# Patient Record
Sex: Female | Born: 1955
Health system: Southern US, Community
[De-identification: ages and names within clinical notes are randomized; demographics above are authoritative.]

## PROBLEM LIST (undated history)

## (undated) DIAGNOSIS — I10 Essential (primary) hypertension: Secondary | ICD-10-CM

## (undated) DIAGNOSIS — K219 Gastro-esophageal reflux disease without esophagitis: Secondary | ICD-10-CM

## (undated) DIAGNOSIS — S82851A Displaced trimalleolar fracture of right lower leg, initial encounter for closed fracture: Secondary | ICD-10-CM

## (undated) DIAGNOSIS — I480 Paroxysmal atrial fibrillation: Secondary | ICD-10-CM

## (undated) DIAGNOSIS — E785 Hyperlipidemia, unspecified: Secondary | ICD-10-CM

## (undated) DIAGNOSIS — F32A Depression, unspecified: Secondary | ICD-10-CM

## (undated) DIAGNOSIS — E669 Obesity, unspecified: Secondary | ICD-10-CM

## (undated) DIAGNOSIS — F419 Anxiety disorder, unspecified: Secondary | ICD-10-CM

## (undated) HISTORY — DX: Hyperlipidemia, unspecified: E78.5

## (undated) HISTORY — DX: Essential (primary) hypertension: I10

## (undated) HISTORY — PX: EYE SURGERY: SHX253

## (undated) HISTORY — DX: Obesity, unspecified: E66.9

## (undated) HISTORY — PX: ABDOMINAL HYSTERECTOMY: SHX81

## (undated) HISTORY — DX: Paroxysmal atrial fibrillation: I48.0

---

## 1995-07-04 HISTORY — PX: KIDNEY STONE SURGERY: SHX686

## 1998-03-02 ENCOUNTER — Emergency Department (HOSPITAL_COMMUNITY): Admission: EM | Admit: 1998-03-02 | Discharge: 1998-03-03 | Payer: Self-pay | Admitting: Emergency Medicine

## 1998-12-07 ENCOUNTER — Other Ambulatory Visit: Admission: RE | Admit: 1998-12-07 | Discharge: 1998-12-07 | Payer: Self-pay | Admitting: Obstetrics & Gynecology

## 2000-01-05 ENCOUNTER — Other Ambulatory Visit: Admission: RE | Admit: 2000-01-05 | Discharge: 2000-01-05 | Payer: Self-pay | Admitting: Obstetrics & Gynecology

## 2001-02-04 ENCOUNTER — Other Ambulatory Visit: Admission: RE | Admit: 2001-02-04 | Discharge: 2001-02-04 | Payer: Self-pay | Admitting: Obstetrics & Gynecology

## 2002-03-12 ENCOUNTER — Other Ambulatory Visit: Admission: RE | Admit: 2002-03-12 | Discharge: 2002-03-12 | Payer: Self-pay | Admitting: Obstetrics & Gynecology

## 2003-04-28 ENCOUNTER — Other Ambulatory Visit: Admission: RE | Admit: 2003-04-28 | Discharge: 2003-04-28 | Payer: Self-pay | Admitting: Obstetrics & Gynecology

## 2003-06-24 ENCOUNTER — Encounter (INDEPENDENT_AMBULATORY_CARE_PROVIDER_SITE_OTHER): Payer: Self-pay

## 2003-06-24 ENCOUNTER — Ambulatory Visit (HOSPITAL_COMMUNITY): Admission: RE | Admit: 2003-06-24 | Discharge: 2003-06-24 | Payer: Self-pay | Admitting: Obstetrics & Gynecology

## 2003-07-07 ENCOUNTER — Inpatient Hospital Stay (HOSPITAL_COMMUNITY): Admission: AD | Admit: 2003-07-07 | Discharge: 2003-07-07 | Payer: Self-pay | Admitting: Obstetrics and Gynecology

## 2003-08-03 ENCOUNTER — Inpatient Hospital Stay (HOSPITAL_COMMUNITY): Admission: RE | Admit: 2003-08-03 | Discharge: 2003-08-05 | Payer: Self-pay | Admitting: Obstetrics & Gynecology

## 2003-08-03 ENCOUNTER — Encounter (INDEPENDENT_AMBULATORY_CARE_PROVIDER_SITE_OTHER): Payer: Self-pay | Admitting: Specialist

## 2006-11-29 ENCOUNTER — Encounter: Admission: RE | Admit: 2006-11-29 | Discharge: 2006-11-29 | Payer: Self-pay | Admitting: Family Medicine

## 2010-07-25 ENCOUNTER — Encounter: Payer: Self-pay | Admitting: Obstetrics & Gynecology

## 2010-11-18 NOTE — H&P (Signed)
NAME:  Brandi Waters, Brandi Waters                         ACCOUNT NO.:  [id number]   MEDICAL RECORD NO.:  [id number]                   PATIENT TYPE:  INP   LOCATION:  NA                                   FACILITY:  [facility]   PHYSICIAN:  Gerrit Friends. Aldona Bar, M.D.                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  Brandi Waters has been a patient of mine for a long period of time  and in October of 2004, she came in for an annual examination and had a Pap  smear that was consistent with CIN 2.  She subsequently underwent colposcopy  and biopsy which revealed wide spread moderate dysplasia. Because of the  wide spread nature of the dysplasia, a decision was made to proceed with a  conization of the cervix which was carried out on December 22.  The  conization pathology report revealed severe dysplasia--carcinoma in situ,  margins were free and no invasive disease was noted.  The carcinoma in situ  was rather wide spread however.  The patient did have some problems with  some bleeding postoperatively but basically has done well.  Because of the  pathologic report and the patient's desire for further surgery along with my  concern of the wide spread nature of the carcinoma in situ on the  conization, a decision was made to proceed with a total abdominal  hysterectomy and bilateral salpingo-oophorectomy.  The patient's admitted  for such procedure at this time.   Her recent mammogram in October was within normal limits.   PAST MEDICAL HISTORY/MEDICATIONS:  1. Lopressor 50 mg daily.  2. __________ 50 mg twice daily.  3. Zoloft 1/2 of a 50 mg tablet on an as needed basis.  4.  Xanax 0.25 mg on an as needed basis.   The patient has no known allergies, she is a nonsmoker.   PAST SURGICAL HISTORY:  None with the exception of above.   FAMILY AND SOCIAL HISTORY:  Noncontributory.   PHYSICAL EXAMINATION ON ADMISSION:  GENERAL:  Well-developed, white female.  VITAL SIGNS:  Weight 195, blood pressure 110/70, pulse 80 and regular,  respirations 18 and regular, temperature 98.2.  HEENT:  Negative. Thyroid not enlarged.  CHEST:  Clear to auscultation and percussion.  CARDIOVASCULAR:  Regular rhythm with no murmur.  BREASTS:  Negative.  ABDOMEN:  Unremarkable.  PELVIC:  External genitalia, BUS normal.  Introitus by digital vault well  supported. Cervix is very difficult to visualize secondary to decreased  mobility of the uterus.  The uterus however feels to be upper limits of  normal size  but is not mobile-does not pull down at all.  Adnexa is  unremarkable.  Rectovaginal exam confirmatory.  EXTREMITIES:  Negative.  NEUROLOGIC:  Physiologic.   IMPRESSION:  Carcinoma in situ of the uterine cervix status post conization  December 2004.   PLAN:  Total abdominal hysterectomy and bilateral salpingo-oophorectomy. The  risks and benefits of surgical procedure have been totally discussed in  detail with the patient and she understands and is ready to proceed.                                               Gerrit Friends. Aldona Bar, M.D.    RMW/MEDQ  D:  07/31/2003  T:  07/31/2003  Job:  [id number]

## 2010-11-18 NOTE — Op Note (Signed)
NAMESHUNTAVIA, YERBY                         ACCOUNT NO.:  [id number]   MEDICAL RECORD NO.:  [id number]                   PATIENT TYPE:  INP   LOCATION:  9399                                 [facility]ACILITY:  [facility]   PHYSICIAN:  Gerrit Friends. Aldona Bar, M.D.                DATE OF BIRTH:  [birth year]   DATE OF PROCEDURE:  08/03/2003  DATE OF DISCHARGE:                                 OPERATIVE REPORT   PREOPERATIVE DIAGNOSIS:  Carcinoma in situ of the uterine cervix - status  post conization June 24, 2003.   POSTOPERATIVE DIAGNOSIS:  Carcinoma in situ if the uterine cervix - status  post conization June 24, 2003.  Pathology pending.   PROCEDURE:  Total abdominal hysterectomy with bilateral salpingo-  oophorectomy.   SURGEON:  Gerrit Friends. Aldona Bar, M.D.   ASSISTANTLuvenia Redden, M.D.   DESCRIPTION OF PROCEDURE:  The patient was taken to the operating room  where, after the satisfactory induction of general endotracheal anesthesia,  she was prepped and draped, having been placed in the supine position.  A  Foley catheter was inserted as part of the prep.   After the patient was adequately draped, the procedure was begun.  A  Pfannenstiel incision was made and with minimal difficulty, dissected down  sharply to and through the fascia in a low transverse fashion with  hemostasis created at each layer.  Once the fascia was opened, subfascial  space was created inferiorly and superiorly.  Muscles separated in the  midline.  Peritoneum identified and entered appropriately with care taken to  avoid the bowels superiorly and the bladder inferiorly.  At this time,  inspection of the abdomen was carried out.  The uterus was small, posterior,  was not very mobile.  Both ovaries appeared normal.  There was no evidence  of any endometriosis or adhesions.  The O'Connor-O'Sullivan retractor was  placed and bowel was packed off without difficulty.  At this time,  hysterectomy was begun in the usual  fashion.  Two long Kelly clamps were  placed in the cornua of the uterus and the uterus was elevated.  The round  ligaments were isolated, suture secured, opened with the Bovie as was the  anterior flap.  Bladder was pushed off the lower uterine segment.  At this  time, the infundibulopelvic pedicles were isolated and after identification  of the ureters, clamped and doubly suture secured.  The uterine artery  pedicles were then clamped using curved Heaney clamps and suture secured  with 0 Vicryl suture and parametrial bite on each side was taken  accordingly.  The previous conization specimen was a large cone and there  was not much cervix left.  After parametrial bites bilaterally, there was  only the uterosacral ligament left to be dealt with and after this was  clamped and cut, the specimen essentially was removed.  The vagina was  identified.  The  vagina was closed with 0 Vicryl in a figure-of-eight  fashion.  Hemostasis was noted to be very adequate.  Again, the ureters were  identified bilaterally and urine output at this time was noted to be clear  and adequate volume.  The procedure at this time, was felt to be complete.  Packs were removed.  Retractor was removed.  Appendix was identified and was  very retrocecal but normal.  It was left in situ.  At this time, pelvis  again was inspected and noted to be dry.  After all counts were noted to be  correct and after no foreign bodies were noted to be remaining in the  abdominal cavity, closure of the abdomen was carried out in layers.  The  abdominal peritoneum was closed with 0 Vicryl in a running fashion.  Muscles  were then secured with interrupted 0 Vicryl.  Assured of good subfascial  hemostasis.  The fascia was then reapproximated using 0 Vicryl from angle to  midline bilaterally.  Subcutaneous tissue was rendered  hemostatic and  reapproximated with 2-0 plain in interrupted subcuticular fashion.  Staples  were then used to  close the skin.  Sterile pressure dressing was applied.  At this time, the patient was transported to the recovery room in  satisfactory condition having tolerated the procedure well.   ESTIMATED BLOOD LOSS:  200 mL.   All counts correct x2.   SUMMARY:  This patient underwent a conization of the cervix on June 24, 2003, for a wide spread carcinoma in situ of the cervix.  The patient, after  appropriate discussion, decided that it was important to her to remove the  uterus and therefore, this procedure was carried out.   The specimen itself looked as if there was not much cervix left, consistent  with the large conization that was done.   CONDITION ON ARRIVAL TO THE RECOVERY ROOM:  Satisfactory.                                               Gerrit Friends. Aldona Bar, M.D.    RMW/MEDQ  D:  08/03/2003  T:  08/03/2003  Job:  [id number]

## 2010-11-18 NOTE — Discharge Summary (Signed)
[name], Brandi Waters                         ACCOUNT NO.:  [id number]   MEDICAL RECORD NO.:  [id number]                   PATIENT TYPE:  INP   LOCATION:  9320                                 [facility]ACILITY:  [facility]   PHYSICIAN:  Gerrit Friends. Aldona Bar, M.D.                DATE OF BIRTH:  Oct 28, 1955   DATE OF ADMISSION:  08/03/2003  DATE OF DISCHARGE:  08/05/2003                                 DISCHARGE SUMMARY   DISCHARGE DIAGNOSIS:  Carcinoma in situ of the uterine cervix status post  previous conization.  Final pathology on hysterectomy pending.   PROCEDURES:  1. Total abdominal hysterectomy.  2. Bilateral salpingo-oophorectomy.   HISTORY:  This patient after many years of having a normal Pap smear  developed moderate dysplasia on her Pap smear in the Fall of 2004.  Colposcopy revealed widespread moderate dysplasia confirmed by biopsy.  She  was taken to the operating room for conization of her cervix on June 24, 2003 at which time widespread carcinoma in situ was encountered.  Her  postoperative course after the conization was complicated by several  episodes of bleeding, none of which required significant intervention.  After discussion with the patient she decided that the option of  hysterectomy was her choice and accordingly she was admitted after a normal  preoperative workup on August 03, 2003 at which time she underwent a total  abdominal hysterectomy and bilateral salpingo-oophorectomy.  Her  postoperative course was totally benign.  Her discharge hemoglobin was 10.9  with a white count of 9300 and a platelet count of 164,000.  She remained  afebrile during her hospital course.  At the time of discharge her wound was  Steri-Stripped after her staples were removed, she was having normal bowel  and bladder function and remained afebrile as she was during her entire  hospital course.  Discharge medications include Tylox one to two every 4-6  hours as needed for severe pain, Motrin  600 mg every 6 hours as needed for  less severe pain, over-the-counter medications for constipation and gas as  needed, and Vivelle-Dot 0.075 changed twice weekly.  She will return to the  office in followup in approx 3-4 weeks' time.  At the time of discharge her  pathology report was still pending.  Condition on discharge improved.                                               Gerrit Friends. Aldona Bar, M.D.    RMW/MEDQ  D:  08/05/2003  T:  08/05/2003  Job:  [id number]

## 2010-11-18 NOTE — Op Note (Signed)
NAME:  Brandi Waters, Brandi Waters                         ACCOUNT NO.:  [id number]   MEDICAL RECORD NO.:  [id number]                   PATIENT TYPE:  AMB   LOCATION:  [facility]                                 [facility]ILITY:  [facility]   PHYSICIAN:  Gerrit Friends. Aldona Bar, M.D.                DATE OF BIRTH:  Jan 08, 1956   DATE OF PROCEDURE:  06/24/2003  DATE OF DISCHARGE:                                 OPERATIVE REPORT   PREOPERATIVE DIAGNOSIS:  Postoperative conization of cervix with bleeding.   POSTOPERATIVE DIAGNOSIS:  Postoperative conization of cervix with bleeding.   PROCEDURE:  Re-exploration of bed of conization of cervix and placement of  lateral sutures.   SURGEON:  Gerrit Friends. Aldona Bar, M.D.   ANESTHESIA:  Intravenous conscious sedation.   HISTORY:  This patient underwent a conization of her cervix on the morning  of June 24, 2003, and upon arriving at home after discharge from the  PACU noticed bright red vaginal bleeding and called.  She was advised to  come to triage, which she did, and was taken to the operating room for re-  evaluation/re-exploration of the cone bed and whatever hemostatic procedures  were needed.   The patient was taken to the operating room, where after the satisfactory  induction of intravenous conscious sedation she was prepped and draped  having been placed in the lithotomy position in the short Allen stirrups.  The bladder was drained of clear urine with rubber catheter in in-and-out  fashion, and a medium vaginal speculum was placed and exposure of the  conization site was carried out.  After profuse suctioning there was not an  apparent bleeder.  To aid in exposure, lateral sutures were placed using #1  Vicryl.  Not only did this expose the cervical bed better but also hopefully  provided additional hemostasis to the cone bed with lateral vessels being  essentially suture-secured.  At this time re-exploration of the cone bed was  carried out and no bleeding was noted.  Monsel's solution was reapplied and  the area was watched closely with no bleeding or oozing noted.  The vagina  was cleansed with warm saline and again the cervical bed inspected and noted  to be dry.  The bladder again was drained of clear urine with a rubber  catheter in an in-and-out fashion.  At this time the procedure was felt to  be complete and was terminated.  Estimated blood loss approximately 25 mL.  All counts correct.  No specimen was obtained.  The patient at this time was  taken to the recovery room in satisfactory condition, having tolerated the  procedure well.  She will be given appropriate instructions for discharge  and return to the office as previously scheduled or as needed.  Gerrit Friends. Aldona Bar, M.D.    RMW/MEDQ  D:  06/24/2003  T:  06/25/2003  Job:  [id number]

## 2010-11-18 NOTE — Op Note (Signed)
NAME:  Brandi Waters, Brandi Waters                         ACCOUNT NO.:  [id number]   MEDICAL RECORD NO.:  [id number]                   PATIENT TYPE:  AMB   LOCATION:  [facility]                                  [facility]   PHYSICIAN:  Gerrit Friends. Aldona Bar, M.D.                DATE OF BIRTH:  10/12/1955   DATE OF PROCEDURE:  06/24/2003  DATE OF DISCHARGE:                                 OPERATIVE REPORT   PREOPERATIVE DIAGNOSIS:  Moderate dysplasia of the cervix.   POSTOPERATIVE DIAGNOSIS:  Moderate dysplasia of the cervix, pathology  pending.   PROCEDURE:  Conization of the cervix.   SURGEON:  Gerrit Friends. Aldona Bar, M.D.   ANESTHESIA:  Intravenous conscious sedation and local anesthesia to the  cervix, 1% Xylocaine with epinephrine.   HISTORY:  This 55 year old patient, a gravida 1, para 1, was noted to have  moderate dysplasia.  She underwent a colposcopy, which revealed widespread  moderate dysplasia.  Endocervical curettage was borderline positive for  dysplasia.  All her previous Pap smears prior to October 2004 were normal.  She is on Nor-QD, continuous progesterone for birth control, and has no  menses.  She is taken to the operating room now for conization of the cervix  because of the widespread nature of her moderate dysplasia.   DESCRIPTION OF PROCEDURE:  The patient was taken to the operating room,  where after the satisfactory induction of intravenous conscious sedation the  patient was prepped and draped in the usual fashion, having been placed in  the short Allen stirrups in the modified lithotomy position.  The bladder  was drained of clear urine through a red rubber catheter in in-and-out  fashion.  A speculum was placed.  The vaginal sidewalls were somewhat  obstructing the cervix, which unfortunately did not pull down well at all.  Nonetheless, it was possible to grasp the cervix with a single-tooth  tenaculum and infiltrate circumferentially about the cervix with  approximately 18 mL  of 1% Xylocaine with epinephrine.  Thereafter, using the  #11 blade it was possible to do a wide conization of the cervix,  encompassing the entire area of ectropion.  This was carried to a depth of  approximately 1.5 cm to encompass most of the endocervical canal.   Once the conization specimen was removed, it was pinned at 12 o'clock and  sent to the lab.  Coagulation of the cervical bed was then begun with the  coagulation instrument, and this was carried out until felt hemostatic.  Thereafter, using Monsel's solution in the cervical bed, hopefully complete  hemostasis was achieved.  This was observed and noted to be dry afterwards.  As mentioned previously, the cervix did not pull down well at all.   At the conclusion of the procedure all instruments were removed, the  cervical bed was dry, and the patient was transported to the recovery room  in satisfactory condition,  having tolerated the procedure well.  Estimated  blood loss 25 mL.  All counts correct x2.  Specimen as mentioned includes  the conization of the cervix with a pin at 12 o'clock.  An endocervical  curetting was not done due to the depth of the conization specimen.   The patient will be discharged to home with follow-up in the office in  several weeks' time with appropriate instructions at the time of discharge.  Condition on arrival to the recovery room is satisfactory.  All counts  correct.  Specimen as mentioned.                                               Gerrit Friends. Aldona Bar, M.D.    RMW/MEDQ  D:  06/24/2003  T:  06/24/2003  Job:  [id number]

## 2011-05-04 ENCOUNTER — Other Ambulatory Visit: Payer: Self-pay | Admitting: Gastroenterology

## 2011-10-27 HISTORY — PX: CARDIOVASCULAR STRESS TEST: SHX262

## 2011-10-27 HISTORY — PX: US ECHOCARDIOGRAPHY: HXRAD669

## 2012-11-27 ENCOUNTER — Other Ambulatory Visit: Payer: Self-pay | Admitting: *Deleted

## 2012-11-27 MED ORDER — METOPROLOL TARTRATE 25 MG PO TABS: 25.0000 mg | ORAL_TABLET | Freq: Two times a day (BID) | ORAL | Status: DC

## 2013-04-22 ENCOUNTER — Other Ambulatory Visit: Payer: Self-pay | Admitting: Cardiovascular Disease

## 2013-04-23 NOTE — Telephone Encounter (Signed)
Rx was sent to pharmacy electronically. 

## 2013-07-16 ENCOUNTER — Other Ambulatory Visit: Payer: Self-pay | Admitting: Cardiovascular Disease

## 2013-07-16 NOTE — Telephone Encounter (Signed)
Rx was sent to pharmacy electronically. 

## 2013-08-08 ENCOUNTER — Ambulatory Visit: Payer: Self-pay | Admitting: Cardiovascular Disease

## 2013-08-28 ENCOUNTER — Ambulatory Visit: Payer: Self-pay | Admitting: Cardiovascular Disease

## 2013-09-15 ENCOUNTER — Other Ambulatory Visit: Payer: Self-pay | Admitting: Cardiovascular Disease

## 2013-09-15 NOTE — Telephone Encounter (Signed)
Rx was sent to pharmacy electronically. 

## 2013-09-22 ENCOUNTER — Ambulatory Visit (INDEPENDENT_AMBULATORY_CARE_PROVIDER_SITE_OTHER): Payer: 59 | Admitting: Cardiovascular Disease

## 2013-09-22 ENCOUNTER — Encounter: Payer: Self-pay | Admitting: Cardiovascular Disease

## 2013-09-22 ENCOUNTER — Encounter: Payer: Self-pay | Admitting: *Deleted

## 2013-09-22 VITALS — BP 110/60 | HR 60 | Resp 16 | Ht 66.0 in | Wt 180.8 lb

## 2013-09-22 DIAGNOSIS — E669 Obesity, unspecified: Secondary | ICD-10-CM

## 2013-09-22 DIAGNOSIS — I4891 Unspecified atrial fibrillation: Secondary | ICD-10-CM

## 2013-09-22 DIAGNOSIS — E785 Hyperlipidemia, unspecified: Secondary | ICD-10-CM

## 2013-09-22 DIAGNOSIS — I10 Essential (primary) hypertension: Secondary | ICD-10-CM

## 2013-09-22 NOTE — Patient Instructions (Signed)
Exercise Stress Electrocardiogram An exercise stress electrocardiogram is a test that is done to evaluate the blood supply to your heart. This test may also be called exercise stress electrocardiography. The test is done while you are walking on a treadmill. The goal of this test is to raise your heart rate. This test is done to find areas of poor blood flow to the heart by determining the extent of coronary artery disease (CAD).   CAD is defined as narrowing in one or more heart (coronary) arteries of more than 70%. If you have an abnormal test result, this may mean that you are not getting adequate blood flow to your heart during exercise. Additional testing may be needed to understand why your test was abnormal. LET [facility] PROVIDER KNOW ABOUT:   Any allergies you have.  All medicines you are taking, including vitamins, herbs, eye drops, creams, and over-the-counter medicines.  Previous problems you or members of your family have had with the use of anesthetics.  Any blood disorders you have.  Previous surgeries you have had.  Medical conditions you have.  Possibility of pregnancy, if this applies. RISKS AND COMPLICATIONS Generally, this is a safe procedure. However, as with any procedure, complications can occur. Possible complications can include:  Pain or pressure in the following areas:  Chest.  Jaw or neck.  Between your shoulder blades.  Radiating down your left arm.  Dizziness or lightheadedness.  Shortness of breath.  Increased or irregular heartbeats.  Nausea or vomiting.  Heart attack (rare). BEFORE THE PROCEDURE  Avoid all forms of caffeine 24 hours before your test or as directed by your health care provider. This includes coffee, tea (even decaffeinated tea), caffeinated sodas, chocolate, cocoa, and certain pain medicines.  Follow your health care provider's instructions regarding eating and drinking before the test.  Take your medicines as  directed at regular times with water unless instructed otherwise. Exceptions may include:  If you have diabetes, ask how you are to take your insulin or pills. It is common to adjust insulin dosing the morning of the test.  If you are taking beta-blocker medicines, it is important to talk to your health care provider about these medicines well before the date of your test. Taking beta-blocker medicines may interfere with the test. In some cases, these medicines need to be changed or stopped 24 hours or more before the test.  If you wear a nitroglycerin patch, it may need to be removed prior to the test. Ask your health care provider if the patch should be removed before the test.  If you use an inhaler for any breathing condition, bring it with you to the test.  If you are an outpatient, bring a snack so you can eat right after the stress phase of the test.  Do not smoke for 4 hours prior to the test or as directed by your health care provider.  Do not apply lotions, powders, creams, or oils on your chest prior to the test.  Wear loose-fitting clothes and comfortable shoes for the test. This test involves walking on a treadmill. PROCEDURE  Multiple patches (electrodes) will be put on your chest. If needed, small areas of your chest may have to be shaved to get better contact with the electrodes. Once the electrodes are attached to your body, multiple wires will be attached to the electrodes and your heart rate will be monitored.  Your heart will be monitored both at rest and while exercising.  You  will walk on a treadmill. The treadmill will be started at a slow pace. The treadmill speed and incline will gradually be increased to raise your heart rate.  The test can be expected to last 1 2 hours. AFTER THE PROCEDURE:  Your heart rate and blood pressure will be monitored after the test.  You may return to your normal schedule including diet, activities, and medicines, unless your health  care provider tells you otherwise. Document Released: 06/16/2000 Document Revised: 04/09/2013 Document Reviewed: 02/24/2013 [facility] Patient Information 2014 [place], [place].  Your physician recommends that you schedule a follow-up appointment in: ONE YEAR with Dr. Royann Shivers.

## 2013-09-26 ENCOUNTER — Encounter: Payer: Self-pay | Admitting: Cardiovascular Disease

## 2013-09-26 DIAGNOSIS — E669 Obesity, unspecified: Secondary | ICD-10-CM | POA: Insufficient documentation

## 2013-09-26 DIAGNOSIS — I480 Paroxysmal atrial fibrillation: Secondary | ICD-10-CM | POA: Insufficient documentation

## 2013-09-26 DIAGNOSIS — E785 Hyperlipidemia, unspecified: Secondary | ICD-10-CM | POA: Insufficient documentation

## 2013-09-26 NOTE — Progress Notes (Signed)
Patient ID: Brandi Waters, female   DOB: [birth year], 58 y.o.   MRN: [id number]      Reason for office visit Atrial fibrillation  Mrs. Safran is a 58 year old woman with a history of paroxysmal atrial fibrillation that has responded well to treatment with flecainide. She does not have structural heart disease. She had a normal echocardiogram in 2013 and normal left ventricular ejection fraction and normal left atrial size. She had normal treadmill exercise stress test in April of 2013. She has been having some success with weight loss and now is no longer obese but only overweight. She exercises regularly. She denies problems with palpitations, dyspnea, angina, syncope or edema   Allergies  Allergen Reactions  . Atorvastatin     GL upset    Current Outpatient Prescriptions  Medication Sig Dispense Refill  . ALPRAZolam (XANAX) 0.25 MG tablet Take 0.25 mg by mouth at bedtime as needed for sleep.      Marland Kitchen aspirin 81 MG tablet Take 81 mg by mouth daily.      . flecainide (TAMBOCOR) 50 MG tablet TAKE 1 TABLET BY MOUTH TWICE DAILY  60 tablet  4  . metoprolol tartrate (LOPRESSOR) 25 MG tablet TAKE 1 TABLET BY MOUTH TWICE DAILY  60 tablet  0  . sertraline (ZOLOFT) 25 MG tablet Take 25 mg by mouth daily.       No current facility-administered medications for this visit.    Past Medical History  Diagnosis Date  . PAF (paroxysmal atrial fibrillation)   . Systemic hypertension   . Hyperlipidemia   . Obesity     Past Surgical History  Procedure Laterality Date  . Kidney stone surgery  1997  . US echocardiography  10/27/2011    trace TR, RV systolic function borderline reduced.  . Cardiovascular stress test  10/27/2011    Negative Bruce Protocol    Family History  Problem Relation Age of Onset  . Cancer Mother     breast  . Cancer Father     bladder  . Hypertension Father     History   Social History  . Marital Status: Married    Spouse Name: N/A    Number of Children: N/A    . Years of Education: N/A   Occupational History  . Not on file.   Social History Main Topics  . Smoking status: Never Smoker   . Smokeless tobacco: Not on file  . Alcohol Use: Yes     Comment: occas.  . Drug Use: No  . Sexual Activity: Not on file   Other Topics Concern  . Not on file   Social History Narrative  . No narrative on file    Review of systems: The patient specifically denies any chest pain at rest or with exertion, dyspnea at rest or with exertion, orthopnea, paroxysmal nocturnal dyspnea, syncope, palpitations, focal neurological deficits, intermittent claudication, lower extremity edema, unexplained weight gain, cough, hemoptysis or wheezing.  The patient also denies abdominal pain, nausea, vomiting, dysphagia, diarrhea, constipation, polyuria, polydipsia, dysuria, hematuria, frequency, urgency, abnormal bleeding or bruising, fever, chills, unexpected weight changes, mood swings, change in skin or hair texture, change in voice quality, auditory or visual problems, allergic reactions or rashes, new musculoskeletal complaints other than usual "aches and pains".   PHYSICAL EXAM BP 110/60  Pulse 60  Resp 16  Ht 5\' 6"  (1.676 m)  Wt 82.01 kg (180 lb 12.8 oz)  BMI 29.20 kg/m2  General: Alert, oriented x3, no distress Head:  no evidence of trauma, PERRL, EOMI, no exophtalmos or lid lag, no myxedema, no xanthelasma; normal ears, nose and oropharynx Neck: normal jugular venous pulsations and no hepatojugular reflux; brisk carotid pulses without delay and no carotid bruits Chest: clear to auscultation, no signs of consolidation by percussion or palpation, normal fremitus, symmetrical and full respiratory excursions Cardiovascular: normal position and quality of the apical impulse, regular rhythm, normal first and second heart sounds, no murmurs, rubs or gallops Abdomen: no tenderness or distention, no masses by palpation, no abnormal pulsatility or arterial bruits, normal  bowel sounds, no hepatosplenomegaly Extremities: no clubbing, cyanosis or edema; 2+ radial, ulnar and brachial pulses bilaterally; 2+ right femoral, posterior tibial and dorsalis pedis pulses; 2+ left femoral, posterior tibial and dorsalis pedis pulses; no subclavian or femoral bruits Neurological: grossly nonfocal   EKG: Normal sinus rhythm, RSR prime pattern in lead V1 but normal total QRS duration. Normal QT interval  Lipid Panel March 2014 total cholesterol 180, triglycerides 101, HDL 49, LDL 111, creatinine 0.61   ASSESSMENT AND PLAN  Mrs. Ezzard Standingewman has infrequent paroxysmal atrial fibrillation that has responded very well to flecainide, which she has now taken without incident since 2003. We have discussed the fact that this medication is safe as long as she does not have structural heart disease. Her coronary risk factors are well addressed. Periodic simple exercise stress testing would be beneficial and reassuring. Other than that she'll continue yearly followup. I congratulated her on successful weight loss and encouraged her to keep up the good work.   Orders Placed This Encounter  Procedures  . EKG 12-Lead  . Exercise Tolerance Test   No orders of the defined types were placed in this encounter.    Junious SilkROITORU,Dejan Angert  Alima Naser, MD, [facility] Center[facility]ACC [facility] [phone] office [phone] pager

## 2013-10-01 ENCOUNTER — Other Ambulatory Visit: Payer: Self-pay | Admitting: Cardiovascular Disease

## 2013-10-01 NOTE — Telephone Encounter (Signed)
Rx was sent to pharmacy electronically. 

## 2013-10-09 ENCOUNTER — Other Ambulatory Visit: Payer: Self-pay | Admitting: Cardiovascular Disease

## 2013-10-09 NOTE — Telephone Encounter (Signed)
Rx was sent to pharmacy electronically. 

## 2013-11-14 ENCOUNTER — Telehealth (HOSPITAL_COMMUNITY): Payer: Self-pay | Admitting: *Deleted

## 2013-11-14 ENCOUNTER — Telehealth (HOSPITAL_COMMUNITY): Payer: Self-pay

## 2013-11-20 ENCOUNTER — Encounter (HOSPITAL_COMMUNITY): Payer: 59

## 2014-01-08 NOTE — Telephone Encounter (Signed)
Encounter complete. 

## 2014-03-26 ENCOUNTER — Other Ambulatory Visit: Payer: Self-pay | Admitting: Cardiovascular Disease

## 2014-03-26 NOTE — Telephone Encounter (Signed)
Rx was sent to pharmacy electronically. 

## 2014-04-01 ENCOUNTER — Encounter: Payer: Self-pay | Admitting: Cardiovascular Disease

## 2014-04-20 ENCOUNTER — Telehealth: Payer: Self-pay | Admitting: Cardiovascular Disease

## 2014-04-20 NOTE — Telephone Encounter (Signed)
Pt called in stating that Dr. Salena Saner treats her for Afib and for the past couple of nights she has been having some heart palpitations and would like to be seen by Dr. Salena Saner if possible. Please call   Thanks

## 2014-04-20 NOTE — Telephone Encounter (Signed)
Returned call to patient. She reports she is under a lot of stress and that for the last 2 nights she has had palpitations, described as skipping beats, beginning around 11:15pm and lasting about 1 hour. She has had no medication changes since her last visit in March. She has NO other associated symptoms. She feels OK this am. Should would like to be seen, earlier that her yearly visit (which will be March 2016)  Per Dr. Herbie BaltimoreHarding (DOD) patient advised to continue to take current medications as directed and monitor symptoms. Should be scheduled with a provider as soon as available, but not urgent, but should contact office if symptoms persist or worsen.   Patient notified and scheduled for OV 05/05/14 @ 8am with Franky Mach[place]uke, [place]

## 2014-05-05 ENCOUNTER — Encounter: Payer: Self-pay | Admitting: Cardiology

## 2014-05-05 ENCOUNTER — Ambulatory Visit (INDEPENDENT_AMBULATORY_CARE_PROVIDER_SITE_OTHER): Payer: 59 | Admitting: Cardiology

## 2014-05-05 VITALS — BP 148/90 | HR 72 | Ht 66.0 in | Wt 198.2 lb

## 2014-05-05 DIAGNOSIS — E669 Obesity, unspecified: Secondary | ICD-10-CM

## 2014-05-05 DIAGNOSIS — I480 Paroxysmal atrial fibrillation: Secondary | ICD-10-CM

## 2014-05-05 DIAGNOSIS — I10 Essential (primary) hypertension: Secondary | ICD-10-CM

## 2014-05-05 NOTE — Assessment & Plan Note (Signed)
Controlled, repeat B/P 122/78

## 2014-05-05 NOTE — Progress Notes (Signed)
    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 She does not have structural heart disease. She had a normal echocardiogram in 2013 and normal left ventricular ejection fraction and normal left atrial size. She had normal treadmill exercise stress test in April of 2013. She had been followed by Dr Elsie LincolnGamble. She says she has had PAF since her 30s. She had been doing well until two weeks ago when a friend was involved in a serious MVA on the way to visit her. [name] says she had no tachycardia but frequent palpitations. This has since calmed down.    Current Outpatient Prescriptions  Medication Sig Dispense Refill  . ALPRAZolam (XANAX) 0.25 MG tablet Take 0.25 mg by mouth at bedtime as needed for sleep.    Marland Kitchen. aspirin 81 MG tablet Take 81 mg by mouth daily.    . flecainide (TAMBOCOR) 50 MG tablet TAKE 1 TABLET BY MOUTH TWICE DAILY 60 tablet 6  . metoprolol tartrate (LOPRESSOR) 25 MG tablet TAKE 1 TABLET BY MOUTH TWICE DAILY 60 tablet 10  . sertraline (ZOLOFT) 25 MG tablet Take 25 mg by mouth daily.     No current facility-administered medications for this visit.    Allergies  Allergen Reactions  . Atorvastatin     GL upset    History   Social History  . Marital Status: Married    Spouse Name: Brandi Waters    Number of Children: Brandi Waters  . Years of Education: Brandi Waters   Occupational History  . Not on file.   Social History Main Topics  . Smoking status: Never Smoker   . Smokeless tobacco: Not on file  . Alcohol Use: Yes     Comment: occas.  . Drug Use: No  . Sexual Activity: Not on file   Other Topics Concern  . Not on file   Social History Narrative     Review of Systems: General: negative for chills, fever, night sweats or weight changes.  Cardiovascular: negative  for chest pain, dyspnea on exertion, edema, orthopnea, palpitations, paroxysmal nocturnal dyspnea or shortness of breath Dermatological: negative for rash Respiratory: negative for cough or wheezing Urologic: negative for hematuria Abdominal: negative for nausea, vomiting, diarrhea, bright red blood per rectum, melena, or hematemesis Neurologic: negative for visual changes, syncope, or dizziness All other systems reviewed and are otherwise negative except as noted above.    Blood pressure 148/90, pulse 72, height 5\' 6"  (1.676 m), weight 198 lb 3.2 oz (89.903 kg).  General appearance: alert, cooperative, no distress and moderately obese Neck: no carotid bruit and no JVD Lungs: clear to auscultation bilaterally Heart: regular rate and rhythm  EKG NSR, QTc 444  ASSESSMENT AND PLAN:   PAF (paroxysmal atrial fibrillation) NSR on Flecainide, some recent palpitations  HTN Controlled, repeat B/P 122/78  Obesity (BMI 30-39.9) She has gained some of her wgt back after loosing 50 lbs   PLAN  I encouraged exercise. Continue current Rx, f/u with Dr Royann Shiversroitoru as scheduled.   Neri Samek KPA-C 05/05/2014 8:13 AM

## 2014-05-05 NOTE — Assessment & Plan Note (Signed)
She has gained some of her wgt back after loosing 50 lbs

## 2014-05-05 NOTE — Assessment & Plan Note (Signed)
NSR on Flecainide, some recent palpitations

## 2014-05-05 NOTE — Patient Instructions (Signed)
Your physician recommends that you schedule a follow-up appointment in: March with Dr Royann Shiversroitoru

## 2014-09-07 ENCOUNTER — Ambulatory Visit: Payer: 59 | Admitting: Cardiovascular Disease

## 2014-09-08 ENCOUNTER — Other Ambulatory Visit: Payer: Self-pay | Admitting: Cardiovascular Disease

## 2014-09-27 ENCOUNTER — Emergency Department (HOSPITAL_COMMUNITY)
Admission: EM | Admit: 2014-09-27 | Discharge: 2014-09-27 | Disposition: A | Discharge status: Home / Self Care | Payer: 59 | Attending: Emergency Medicine | Admitting: Emergency Medicine

## 2014-09-27 ENCOUNTER — Encounter (HOSPITAL_COMMUNITY): Payer: Self-pay | Admitting: Emergency Medicine

## 2014-09-27 ENCOUNTER — Emergency Department (HOSPITAL_COMMUNITY): Payer: 59

## 2014-09-27 DIAGNOSIS — X58XXXA Exposure to other specified factors, initial encounter: Secondary | ICD-10-CM | POA: Diagnosis not present

## 2014-09-27 DIAGNOSIS — S99912A Unspecified injury of left ankle, initial encounter: Secondary | ICD-10-CM | POA: Diagnosis present

## 2014-09-27 DIAGNOSIS — Y9289 Other specified places as the place of occurrence of the external cause: Secondary | ICD-10-CM | POA: Insufficient documentation

## 2014-09-27 DIAGNOSIS — Y998 Other external cause status: Secondary | ICD-10-CM | POA: Diagnosis not present

## 2014-09-27 DIAGNOSIS — I10 Essential (primary) hypertension: Secondary | ICD-10-CM | POA: Diagnosis not present

## 2014-09-27 DIAGNOSIS — Z79899 Other long term (current) drug therapy: Secondary | ICD-10-CM | POA: Insufficient documentation

## 2014-09-27 DIAGNOSIS — Y9389 Activity, other specified: Secondary | ICD-10-CM | POA: Diagnosis not present

## 2014-09-27 DIAGNOSIS — I480 Paroxysmal atrial fibrillation: Secondary | ICD-10-CM | POA: Insufficient documentation

## 2014-09-27 DIAGNOSIS — E669 Obesity, unspecified: Secondary | ICD-10-CM | POA: Diagnosis not present

## 2014-09-27 DIAGNOSIS — S93402A Sprain of unspecified ligament of left ankle, initial encounter: Secondary | ICD-10-CM | POA: Insufficient documentation

## 2014-09-27 NOTE — ED Notes (Signed)
Awake. Verbally responsive. A/O x4. Resp even and unlabored. No audible adventitious breath sounds noted. ABC's intact.  

## 2014-09-27 NOTE — ED Provider Notes (Signed)
CSN: [id number]     Arrival date & time 09/27/14  1939 History   This chart was scribed for non-physician practitioner, Antony MaduraKelly Aevah Stansbery, PA working with Mirian MoMatthew Gentry, MD by Gwenyth Oberatherine Macek, ED scribe. This patient was seen in room WHALB/WHALB and the patient's care was started at 9:20 PM   Chief Complaint  Patient presents with  . Ankle Injury   The history is provided by the patient. No language interpreter was used.    HPI Comments: Brandi Waters is a 59 y.o. female who presents to the Emergency Department complaining of constant, moderate left ankle pain that started this morning. Pt reports that she was walking in heels when she inverted and, then, everted her ankle. She states pain becomes worse with walking. Pt has tried Ibuprofen and Tylenol with some relief. She denies numbness or weakness. No radiation of the pain, per patient. No prior injury to her L ankle.  Past Medical History  Diagnosis Date  . PAF (paroxysmal atrial fibrillation)   . Systemic hypertension   . Hyperlipidemia   . Obesity    Past Surgical History  Procedure Laterality Date  . Kidney stone surgery  1997  . [place] echocardiography  10/27/2011    trace TR, RV systolic function borderline reduced.  . Cardiovascular stress test  10/27/2011    Negative Bruce Protocol   Family History  Problem Relation Age of Onset  . Cancer Mother     breast  . Cancer Father     bladder  . Hypertension Father    History  Substance Use Topics  . Smoking status: Never Smoker   . Smokeless tobacco: Not on file  . Alcohol Use: Yes     Comment: occas.   OB History    No data available      Review of Systems  Musculoskeletal: Positive for arthralgias.  Neurological: Negative for numbness.  All other systems reviewed and are negative.   Allergies  Atorvastatin  Home Medications   Prior to Admission medications   Medication Sig Start Date End Date Taking? Authorizing Provider  acetaminophen (TYLENOL) 500 MG tablet  Take 1,000 mg by mouth every 6 (six) hours as needed for moderate pain.   Yes Historical Provider, MD  ALPRAZolam Prudy Feeler(XANAX) 0.25 MG tablet Take 0.25 mg by mouth at bedtime as needed for sleep.   Yes Historical Provider, MD  flecainide (TAMBOCOR) 50 MG tablet TAKE 1 TABLET BY MOUTH TWICE DAILY 03/26/14  Yes Mihai Croitoru, MD  ibuprofen (ADVIL,MOTRIN) 200 MG tablet Take 400 mg by mouth every 6 (six) hours as needed for moderate pain.   Yes Historical Provider, MD  metoprolol tartrate (LOPRESSOR) 25 MG tablet TAKE 1 TABLET BY MOUTH TWICE DAILY 09/08/14  Yes Mihai Croitoru, MD  sertraline (ZOLOFT) 25 MG tablet Take 12.5 mg by mouth daily.    Yes Historical Provider, MD   BP 136/72 mmHg  Pulse 78  Temp(Src) 98.2 F (36.8 C) (Oral)  Resp 18  SpO2 98%   Physical Exam  Constitutional: She is oriented to person, place, and time. She appears well-developed and well-nourished. No distress.  HENT:  Head: Normocephalic and atraumatic.  Eyes: Conjunctivae and EOM are normal. No scleral icterus.  Neck: Normal range of motion.  Cardiovascular: Normal rate, regular rhythm and intact distal pulses.   DP and PT pulses 2+ in the left lower extremity.  Pulmonary/Chest: Effort normal. No respiratory distress.  Musculoskeletal: Normal range of motion.       Left ankle: She  exhibits swelling (mild, anterior and inferior to lateral malleolus). She exhibits normal range of motion, no deformity, no laceration and normal pulse. Tenderness. Lateral malleolus tenderness found. Achilles tendon normal.  Neurological: She is alert and oriented to person, place, and time. She exhibits normal muscle tone. Coordination normal.  Sensation to light touch intact in the left lower extremity. Patellar and Achilles reflexes 2+ in the left lower extremity. Patient able to wiggle all toes.  Skin: Skin is warm and dry. No rash noted. She is not diaphoretic. No erythema. No pallor.  Psychiatric: She has a normal mood and affect. Her  behavior is normal.  Nursing note and vitals reviewed.   ED Course  Procedures   DIAGNOSTIC STUDIES: Oxygen Saturation is 98% on RA, normal by my interpretation.    COORDINATION OF CARE: 9:22 PM Discussed treatment plan with pt which includes A SO ankle and the RICE method. Advised pt to follow-up with orthopedist if no improvement in symptoms. Pt agreed to plan.   Labs Review Labs Reviewed - No data to display  Imaging Review Dg Ankle Complete Left  09/27/2014   CLINICAL DATA:  Inversion injury with ankle pain, initial encounter  EXAM: LEFT ANKLE COMPLETE - 3+ VIEW  COMPARISON:  None.  FINDINGS: There is no evidence of fracture, dislocation, or joint effusion. There is no evidence of arthropathy or other focal bone abnormality. Soft tissues are unremarkable. Small plantar spur is seen.  IMPRESSION: No acute abnormality is noted.   Electronically Signed   By: Alcide Clever M.D.   On: 09/27/2014 20:42   Dg Foot Complete Left  09/27/2014   CLINICAL DATA:  Inversion injury with foot pain  EXAM: LEFT FOOT - COMPLETE 3+ VIEW  COMPARISON:  None.  FINDINGS: There is no evidence of fracture or dislocation. There is no evidence of arthropathy or other focal bone abnormality. Soft tissues are unremarkable. Plantar spur is noted.  IMPRESSION: No acute abnormality is noted.   Electronically Signed   By: Alcide Clever M.D.   On: 09/27/2014 20:43     EKG Interpretation None      MDM   Final diagnoses:  Left ankle sprain, initial encounter    59 year old female presents to the emergency department for further evaluation of left ankle pain after inverting and subsequently even hurting her ankle while wearing heels this morning. Patient is neurovascularly intact. She exhibits normal range of motion of left ankle. No bony tenderness appreciated. No crepitus or deformity. Imaging is negative for fracture, dislocation, or bony deformity. Symptoms consistent with left ankle sprain. Patient to be treated  with ASO ankle, RICE, and NSAIDs. She declines pain medication in ED or as outpatient. Patient follow-up with her primary care doctor. Orthopedic referral given should symptoms persist and return precautions discussed. Patient agreeable to plan with no unaddressed concerns. Patient discharged in good condition.  I personally performed the services described in this documentation, which was scribed in my presence. The recorded information has been reviewed and is accurate.   Filed Vitals:   09/27/14 2005  BP: 136/72  Pulse: 78  Temp: 98.2 F (36.8 C)  TempSrc: Oral  Resp: 18  SpO2: 98%      Antony Madura, PA-C 09/27/14 2134  Mirian Mo, MD 09/28/14 [phone]

## 2014-09-27 NOTE — ED Notes (Signed)
Pt states she rolled her L ankle this morning wearing heels and now has pain where the ankle connects to the foot. Alert and oriented.

## 2014-09-27 NOTE — ED Notes (Signed)
Awake. Verbally responsive. A/O x4. Resp even and unlabored. No audible adventitious breath sounds noted. ABC's intact. Pt reported lt ankle pain. Noted some swelling. No deformity/bruising noted. (+)PMS, CRT brisk. Family at bedside.

## 2014-09-27 NOTE — Discharge Instructions (Signed)
Wear an ankle brace for stability. Recommend 600 mg ibuprofen every 6 hours or 500 mg naproxen/Aleve every 12 hours for inflammation and pain control. Ice your ankle 3-4 times per day. Keep it elevated whenever possible. Follow-up with an orthopedist if symptoms persist or worsen.  Ankle Sprain An ankle sprain is an injury to the strong, fibrous tissues (ligaments) that hold the bones of your ankle joint together.  CAUSES An ankle sprain is usually caused by a fall or by twisting your ankle. Ankle sprains most commonly occur when you step on the outer edge of your foot, and your ankle turns inward. People who participate in sports are more prone to these types of injuries.  SYMPTOMS   Pain in your ankle. The pain may be present at rest or only when you are trying to stand or walk.  Swelling.  Bruising. Bruising may develop immediately or within 1 to 2 days after your injury.  Difficulty standing or walking, particularly when turning corners or changing directions. DIAGNOSIS  Your caregiver will ask you details about your injury and perform a physical exam of your ankle to determine if you have an ankle sprain. During the physical exam, your caregiver will press on and apply pressure to specific areas of your foot and ankle. Your caregiver will try to move your ankle in certain ways. An X-ray exam may be done to be sure a bone was not broken or a ligament did not separate from one of the bones in your ankle (avulsion fracture).  TREATMENT  Certain types of braces can help stabilize your ankle. Your caregiver can make a recommendation for this. Your caregiver may recommend the use of medicine for pain. If your sprain is severe, your caregiver may refer you to a surgeon who helps to restore function to parts of your skeletal system (orthopedist) or a physical therapist. HOME CARE INSTRUCTIONS   Apply ice to your injury for 1-2 days or as directed by your caregiver. Applying ice helps to reduce  inflammation and pain.  Put ice in a plastic bag.  Place a towel between your skin and the bag.  Leave the ice on for 15-20 minutes at a time, every 2 hours while you are awake.  Only take over-the-counter or prescription medicines for pain, discomfort, or fever as directed by your caregiver.  Elevate your injured ankle above the level of your heart as much as possible for 2-3 days.  If your caregiver recommends crutches, use them as instructed. Gradually put weight on the affected ankle. Continue to use crutches or a cane until you can walk without feeling pain in your ankle.  If you have a plaster splint, wear the splint as directed by your caregiver. Do not rest it on anything harder than a pillow for the first 24 hours. Do not put weight on it. Do not get it wet. You may take it off to take a shower or bath.  You may have been given an elastic bandage to wear around your ankle to provide support. If the elastic bandage is too tight (you have numbness or tingling in your foot or your foot becomes cold and blue), adjust the bandage to make it comfortable.  If you have an air splint, you may blow more air into it or let air out to make it more comfortable. You may take your splint off at night and before taking a shower or bath. Wiggle your toes in the splint several times per day to decrease  swelling. SEEK MEDICAL CARE IF:   You have rapidly increasing bruising or swelling.  Your toes feel extremely cold or you lose feeling in your foot.  Your pain is not relieved with medicine. SEEK IMMEDIATE MEDICAL CARE IF:  Your toes are numb or blue.  You have severe pain that is increasing. MAKE SURE YOU:   Understand these instructions.  Will watch your condition.  Will get help right away if you are not doing well or get worse. Document Released: 06/19/2005 Document Revised: 03/13/2012 Document Reviewed: 07/01/2011 [facility] Patient Information 2015 [place], [place]. This information is  not intended to replace advice given to you by your health care provider. Make sure you discuss any questions you have with your health care provider.

## 2014-09-30 ENCOUNTER — Ambulatory Visit (INDEPENDENT_AMBULATORY_CARE_PROVIDER_SITE_OTHER): Payer: 59 | Admitting: Cardiovascular Disease

## 2014-09-30 ENCOUNTER — Encounter: Payer: Self-pay | Admitting: Cardiovascular Disease

## 2014-09-30 VITALS — BP 130/80 | HR 69 | Resp 16 | Ht 66.5 in | Wt 211.1 lb

## 2014-09-30 DIAGNOSIS — I480 Paroxysmal atrial fibrillation: Secondary | ICD-10-CM

## 2014-09-30 DIAGNOSIS — E669 Obesity, unspecified: Secondary | ICD-10-CM

## 2014-09-30 DIAGNOSIS — E785 Hyperlipidemia, unspecified: Secondary | ICD-10-CM

## 2014-09-30 DIAGNOSIS — I498 Other specified cardiac arrhythmias: Secondary | ICD-10-CM | POA: Diagnosis not present

## 2014-09-30 NOTE — Patient Instructions (Signed)
Your physician has requested that you have an exercise tolerance test in June 2016. For further information please visit [email or web address]. Please also follow instruction sheet, as given.  Dr. Royann Shiversroitoru recommends that you schedule a follow-up appointment in: One Year.

## 2014-09-30 NOTE — Progress Notes (Signed)
Patient ID: Valentina LucksMarsha M Conley, female   DOB: Apr 16, 1956, 59 y.o.   MRN: [id number]     Cardiology Office Note   Date:  09/30/2014   ID:  Clint GuyMarsha M Cenci, DOB Apr 16, 1956, MRN [id number]  PCP:  Thora LanceEHINGER,ROBERT R, MD  Cardiologist:   Thurmon FairROITORU,Perrie Ragin, MD   Chief Complaint  Patient presents with  . Follow-up    last OV 05/2014 no complaints.       History of Present Illness: Valentina LucksMarsha M Ebrahim is a 59 y.o. female who presents for follow-up of paroxysmal atrial fibrillation on treatment with flecainide. She appears to have "lone atrial fibrillation" without evidence of structural heart disease by previous echocardiography and stress tests, normal left atrial size, without history of stroke/TIA, congestive heart failure or diabetes mellitus. She bears a diagnosis of hypertension but this is highly questionable. The only antihypertensive medication that she requires is a very low dose of beta blocker, which she takes for the arrhythmia.  She has generally done quite well since her last appointment. She was seen by Corine ShelterLuke Kilroy last fall when she had an increased frequency of palpitations, but this was associated with a lot of emotional distress. Since then she has had rare palpitations that are always very brief, never sustained.  She recently sprained her ankle walking on high heels and has a brace on the left ankle. She is able to do all household chores without restrictions. She has no other cardiac complaints    Past Medical History  Diagnosis Date  . PAF (paroxysmal atrial fibrillation)   . Systemic hypertension   . Hyperlipidemia   . Obesity     Past Surgical History  Procedure Laterality Date  . Kidney stone surgery  1997  . [place] echocardiography  10/27/2011    trace TR, RV systolic function borderline reduced.  . Cardiovascular stress test  10/27/2011    Negative Bruce Protocol     Current Outpatient Prescriptions  Medication Sig Dispense Refill  . acetaminophen (TYLENOL) 500 MG  tablet Take 1,000 mg by mouth every 6 (six) hours as needed for moderate pain.    Marland Kitchen. ALPRAZolam (XANAX) 0.25 MG tablet Take 0.25 mg by mouth at bedtime as needed for sleep.    . flecainide (TAMBOCOR) 50 MG tablet TAKE 1 TABLET BY MOUTH TWICE DAILY 60 tablet 6  . ibuprofen (ADVIL,MOTRIN) 200 MG tablet Take 400 mg by mouth every 6 (six) hours as needed for moderate pain.    . metoprolol tartrate (LOPRESSOR) 25 MG tablet TAKE 1 TABLET BY MOUTH TWICE DAILY 60 tablet 6  . sertraline (ZOLOFT) 25 MG tablet Take 12.5 mg by mouth daily.      No current facility-administered medications for this visit.    Allergies:   Atorvastatin    Social History:  The patient  reports that she has never smoked. She does not have any smokeless tobacco history on file. She reports that she drinks alcohol. She reports that she does not use illicit drugs.   Family History:  The patient's family history includes Cancer in her father and mother; Hypertension in her father.    ROS:  Please see the history of present illness.    Otherwise, review of systems positive for none.   All other systems are reviewed and negative.    PHYSICAL EXAM: VS:  BP 130/80 mmHg  Pulse 69  Resp 16  Ht 5' 6.5" (1.689 m)  Wt 211 lb 1.6 oz (95.754 kg)  BMI 33.57 kg/m2 , BMI Body mass  index is 33.57 kg/(m^2).  General: Alert, oriented x3, no distress Head: no evidence of trauma, PERRL, EOMI, no exophtalmos or lid lag, no myxedema, no xanthelasma; normal ears, nose and oropharynx Neck: normal jugular venous pulsations and no hepatojugular reflux; brisk carotid pulses without delay and no carotid bruits Chest: clear to auscultation, no signs of consolidation by percussion or palpation, normal fremitus, symmetrical and full respiratory excursions Cardiovascular: normal position and quality of the apical impulse, regular rhythm, normal first and second heart sounds, no murmurs, rubs or gallops Abdomen: no tenderness or distention, no masses  by palpation, no abnormal pulsatility or arterial bruits, normal bowel sounds, no hepatosplenomegaly Extremities: no clubbing, cyanosis or edema; 2+ radial, ulnar and brachial pulses bilaterally; 2+ right femoral, posterior tibial and dorsalis pedis pulses; 2+ left femoral, posterior tibial and dorsalis pedis pulses; no subclavian or femoral bruits Neurological: grossly nonfocal Psych: euthymic mood, full affect   EKG:  EKG is ordered today. The ekg ordered today demonstrates NSR QRS 106 ms QTC 462 ms (incomplete right bundle branch block pattern)    Recent Labs: No results found for requested labs within last 365 days.  checked by her primary care physician September, reported by patient as being normal    Lipid Panel No results found for: CHOL, TRIG, HDL, CHOLHDL, VLDL, LDLCALC, LDLDIRECT    Wt Readings from Last 3 Encounters:  09/30/14 211 lb 1.6 oz (95.754 kg)  05/05/14 198 lb 3.2 oz (89.903 kg)  09/22/13 180 lb 12.8 oz (82.01 kg)     ASSESSMENT AND PLAN:  Mrs. Corbridge has minimally symptomatic and infrequent paroxysmal atrial fibrillation with good response to antiarrhythmic therapy with flecainide and metoprolol. View the records/labs from  Dr. Manus Gunning.  She should have a periodic treadmill stress test while she takes flecainide. We'll wait for her ankle to heal.    at this point in time her embolic risk is low. I have reinforced the need for her to take aspirin (she avoids doing this since she is concerned about worsening tinnitus.). I don't think we need to start potent anticoagulants at this time, but when she reaches the age of 89, we probably should.    Current medicines are reviewed at length with the patient today.  The patient does not haveconcerns regarding medicines.  The following changes have been madTake aspirin 81 mg daily or at least every other day if this worsens tinnitus  Labs/ tests ordered today include:  Orders Placed This Encounter  Procedures  . EKG  12-Lead    Patient Instructions  Your physician has requested that you have an exercise tolerance test in June 2016. For further information please visit [email or web address] Please also follow instruction sheet, as given.  Dr. Royann Shivers recommends that you schedule a follow-up appointment in: One Year.          Joie Bimler, MD  09/30/2014 8:48 AM    Thurmon Fair, MD, [facility] [phone] office [phone] pager

## 2014-10-02 NOTE — Addendum Note (Signed)
Addended by: Ronnell GuadalajaraLASSITER, Patton Swisher A on: 10/02/2014 11:18 AM   Modules accepted: Orders

## 2014-10-09 ENCOUNTER — Other Ambulatory Visit: Payer: Self-pay | Admitting: Cardiovascular Disease

## 2014-10-12 NOTE — Telephone Encounter (Signed)
Rx refill sent to patient pharmacy   

## 2015-03-10 ENCOUNTER — Other Ambulatory Visit: Payer: Self-pay | Admitting: Cardiovascular Disease

## 2015-03-10 NOTE — Telephone Encounter (Signed)
Rx request sent to pharmacy.  

## 2015-03-25 ENCOUNTER — Other Ambulatory Visit: Payer: Self-pay | Admitting: Cardiovascular Disease

## 2015-03-25 NOTE — Telephone Encounter (Signed)
Rx request sent to pharmacy.  

## 2015-08-18 ENCOUNTER — Other Ambulatory Visit: Payer: Self-pay | Admitting: Cardiovascular Disease

## 2015-08-18 NOTE — Telephone Encounter (Signed)
Rx(s) sent to pharmacy electronically.  

## 2015-09-14 ENCOUNTER — Other Ambulatory Visit: Payer: Self-pay | Admitting: Cardiovascular Disease

## 2015-09-14 NOTE — Telephone Encounter (Signed)
REFILL 

## 2015-10-01 ENCOUNTER — Encounter: Payer: Self-pay | Admitting: Cardiovascular Disease

## 2015-10-01 ENCOUNTER — Ambulatory Visit (INDEPENDENT_AMBULATORY_CARE_PROVIDER_SITE_OTHER): Payer: 59 | Admitting: Cardiovascular Disease

## 2015-10-01 VITALS — BP 124/80 | HR 70 | Ht 67.0 in | Wt 221.6 lb

## 2015-10-01 DIAGNOSIS — I10 Essential (primary) hypertension: Secondary | ICD-10-CM | POA: Diagnosis not present

## 2015-10-01 DIAGNOSIS — E785 Hyperlipidemia, unspecified: Secondary | ICD-10-CM

## 2015-10-01 DIAGNOSIS — E669 Obesity, unspecified: Secondary | ICD-10-CM

## 2015-10-01 DIAGNOSIS — I480 Paroxysmal atrial fibrillation: Secondary | ICD-10-CM | POA: Diagnosis not present

## 2015-10-01 NOTE — Progress Notes (Signed)
Patient ID: Brandi Waters, female   DOB: 18-Mar-1956, 60 y.o.   MRN: [id number]    Cardiology Office Note    Date:  10/01/2015   ID:  Brandi Waters, Park MeoDOB 18-Mar-1956, MRN [id number]  PCP:  Brandi LanceEHINGER,ROBERT R, MD  Cardiologist:   Brandi FairROITORU,Hilliary Jock, MD   Chief Complaint  Patient presents with  . Annual Exam    patient reports no problems since last visit    History of Present Illness:  Brandi Waters is a 60 y.o. female with a history of "lone" atrial fibrillation in the absence of other cardiac abnormalities. She had normal echocardiogram and stress test in the past. She has had excellent response to flecainide/beta blocker antiarrhythmic therapy and during the last 12 months has not had palpitations at all. Aspirin was prescribed, but she avoids taking it since she is worried it would worsen her tinnitus. She has not had any bleeding or neurological events. She denies exertional dyspnea, angina, leg edema, claudication, syncope, dizziness/lightheadedness.  There is a diagnosis of hypertension, but this is a questionable diagnosis. Her blood pressure is very normal on a nominal dose of metoprolol.  Past Medical History  Diagnosis Date  . PAF (paroxysmal atrial fibrillation) (HCC)   . Systemic hypertension   . Hyperlipidemia   . Obesity     Past Surgical History  Procedure Laterality Date  . Kidney stone surgery  1997  . [place] echocardiography  10/27/2011    trace TR, RV systolic function borderline reduced.  . Cardiovascular stress test  10/27/2011    Negative Bruce Protocol    Current Medications: Outpatient Prescriptions Prior to Visit  Medication Sig Dispense Refill  . acetaminophen (TYLENOL) 500 MG tablet Take 1,000 mg by mouth every 6 (six) hours as needed for moderate pain.    Marland Kitchen. ALPRAZolam (XANAX) 0.25 MG tablet Take 0.25 mg by mouth at bedtime as needed for sleep.    . flecainide (TAMBOCOR) 50 MG tablet TAKE 1 TABLET BY MOUTH TWICE DAILY 60 tablet 0  . ibuprofen (ADVIL,MOTRIN)  200 MG tablet Take 400 mg by mouth every 6 (six) hours as needed for moderate pain.    . metoprolol tartrate (LOPRESSOR) 25 MG tablet TAKE 1 TABLET BY MOUTH TWICE DAILY 60 tablet 0  . sertraline (ZOLOFT) 25 MG tablet Take 12.5 mg by mouth daily.      No facility-administered medications prior to visit.     Allergies:   Atorvastatin   Social History   Social History  . Marital Status: Married    Spouse Name: N/A  . Number of Children: N/A  . Years of Education: N/A   Social History Main Topics  . Smoking status: Never Smoker   . Smokeless tobacco: None  . Alcohol Use: 0.0 oz/week    0 Standard drinks or equivalent per week     Comment: occas.  . Drug Use: No  . Sexual Activity: Not Asked   Other Topics Concern  . None   Social History Narrative     Family History:  The patient's family history includes Cancer in her father and mother; Hypertension in her father.   ROS:   Please see the history of present illness.    ROS All other systems reviewed and are negative.   PHYSICAL EXAM:   VS:  BP 124/80 mmHg  Pulse 70  Ht 5\' 7"  (1.702 m)  Wt 100.517 kg (221 lb 9.6 oz)  BMI 34.70 kg/m2   GEN: Well nourished, well developed, in no  acute distress HEENT: normal Neck: no JVD, carotid bruits, or masses Cardiac: RRR; no murmurs, rubs, or gallops,no edema  Respiratory:  clear to auscultation bilaterally, normal work of breathing GI: soft, nontender, nondistended, + BS MS: no deformity or atrophy Skin: warm and dry, no rash Neuro:  Alert and Oriented x 3, Strength and sensation are intact Psych: euthymic mood, full affect  Wt Readings from Last 3 Encounters:  10/01/15 100.517 kg (221 lb 9.6 oz)  09/30/14 95.754 kg (211 lb 1.6 oz)  05/05/14 89.903 kg (198 lb 3.2 oz)      Studies/Labs Reviewed:   EKG:  EKG is ordered today.  The ekg ordered today demonstrates Sinus rhythm, minor IVCD, incomplete right bundle branch block, QRS 102 ms, QTC 449 ms. Needed a QRS nor the QT  have increased compared to last year.   ASSESSMENT:    1. Paroxysmal atrial fibrillation (HCC)   2. Essential hypertension   3. Hyperlipidemia   4. Obesity (BMI 30-39.9)      PLAN:  In order of problems listed above:  1. AFib: Good control on flecainide without significant side effects. No known structural heart disease and no symptoms of cardiac illness. Consider repeating a treadmill stress test next year on flecainide. Her embolic risk is low (really her only risk factor is her gender). She should take at least aspirin 81 mg daily. If she is unable to take aspirin due to tinnitus and I told her that she will have to take for anticoagulant therapy. She will try to take aspirin on a daily basis. She will call me if this worsens or tinnitus, although I think this is an unlikely scenario. 2. I don't think Brandi Waters truly has hypertension. 3. She reports that labs performed in Dr. Randel Books office showed her cholesterol was "a little high, but the ratio was good". He did not recommend lipid-lowering therapy. 4. She is struggling with attempts at weight loss. Recommended a low carbohydrate diet. Recommended increase physical activity.    Medication Adjustments/Labs and Tests Ordered: Current medicines are reviewed at length with the patient today.  Concerns regarding medicines are outlined above.  Medication changes, Labs and Tests ordered today are listed in the Patient Instructions below. Patient Instructions  Your physician recommends that you continue on your current medications as directed. Please refer to the Current Medication list given to you today.  Dr Royann Shivers recommends that you schedule a follow-up appointment in 1 year. You will receive a reminder letter in the mail two months in advance. If you don't receive a letter, please call our office to schedule the follow-up appointment.  If you need a refill on your cardiac medications before your next appointment, please call  your pharmacy.    Joie Bimler, MD  10/01/2015 8:21 AM    [facility] [address] [place], [place], [place]  [address] Phone: [phone]; Fax: [phone]

## 2015-10-01 NOTE — Patient Instructions (Signed)
Your physician recommends that you continue on your current medications as directed. Please refer to the Current Medication list given to you today.  Dr Croitoru recommends that you schedule a follow-up appointment in 1 year. You will receive a reminder letter in the mail two months in advance. If you don't receive a letter, please call our office to schedule the follow-up appointment.  If you need a refill on your cardiac medications before your next appointment, please call your pharmacy. 

## 2015-10-06 ENCOUNTER — Encounter (HOSPITAL_COMMUNITY): Payer: Self-pay | Admitting: Adult Health

## 2015-10-06 ENCOUNTER — Emergency Department (HOSPITAL_COMMUNITY)
Admission: EM | Admit: 2015-10-06 | Discharge: 2015-10-07 | Disposition: A | Discharge status: Home / Self Care | Payer: 59 | Attending: Emergency Medicine | Admitting: Emergency Medicine

## 2015-10-06 ENCOUNTER — Emergency Department (HOSPITAL_COMMUNITY): Payer: 59

## 2015-10-06 DIAGNOSIS — R002 Palpitations: Secondary | ICD-10-CM | POA: Diagnosis present

## 2015-10-06 DIAGNOSIS — I4892 Unspecified atrial flutter: Secondary | ICD-10-CM | POA: Diagnosis not present

## 2015-10-06 DIAGNOSIS — R110 Nausea: Secondary | ICD-10-CM | POA: Diagnosis not present

## 2015-10-06 DIAGNOSIS — R140 Abdominal distension (gaseous): Secondary | ICD-10-CM | POA: Insufficient documentation

## 2015-10-06 DIAGNOSIS — Z79899 Other long term (current) drug therapy: Secondary | ICD-10-CM | POA: Insufficient documentation

## 2015-10-06 DIAGNOSIS — E669 Obesity, unspecified: Secondary | ICD-10-CM | POA: Insufficient documentation

## 2015-10-06 DIAGNOSIS — I480 Paroxysmal atrial fibrillation: Secondary | ICD-10-CM | POA: Insufficient documentation

## 2015-10-06 DIAGNOSIS — I10 Essential (primary) hypertension: Secondary | ICD-10-CM | POA: Diagnosis not present

## 2015-10-06 DIAGNOSIS — R197 Diarrhea, unspecified: Secondary | ICD-10-CM | POA: Diagnosis not present

## 2015-10-06 LAB — CBC
HCT: 39.6 % (ref 36.0–46.0)
HEMOGLOBIN: 12.6 g/dL (ref 12.0–15.0)
MCH: 28.8 pg (ref 26.0–34.0)
MCHC: 31.8 g/dL (ref 30.0–36.0)
MCV: 90.4 fL (ref 78.0–100.0)
Platelets: 238 10*3/uL (ref 150–400)
RBC: 4.38 MIL/uL (ref 3.87–5.11)
RDW: 14.1 % (ref 11.5–15.5)
WBC: 10.4 10*3/uL (ref 4.0–10.5)

## 2015-10-06 LAB — BASIC METABOLIC PANEL
ANION GAP: 12 (ref 5–15)
BUN: 13 mg/dL (ref 6–20)
CALCIUM: 9.3 mg/dL (ref 8.9–10.3)
CO2: 19 mmol/L — AB (ref 22–32)
Chloride: 106 mmol/L (ref 101–111)
Creatinine, Ser: 0.71 mg/dL (ref 0.44–1.00)
GFR calc non Af Amer: >60 mL/min (ref >60)
Glucose, Bld: 161 mg/dL — ABNORMAL HIGH (ref 65–99)
Potassium: 3.9 mmol/L (ref 3.5–5.1)
Sodium: 137 mmol/L (ref 135–145)

## 2015-10-06 LAB — I-STAT TROPONIN, ED: TROPONIN I, POC: 0 ng/mL (ref 0.00–0.08)

## 2015-10-06 MED ORDER — METOPROLOL TARTRATE 25 MG PO TABS: 25.0000 mg | ORAL_TABLET | Freq: Once | ORAL | Status: DC

## 2015-10-06 MED ORDER — FLECAINIDE ACETATE 100 MG PO TABS
100.0000 mg | ORAL_TABLET | Freq: Once | ORAL | Status: DC
  Filled 2015-10-06: qty 1

## 2015-10-06 NOTE — ED Notes (Signed)
Presents with "I had a large dinner and one glass of wine this evening, I started feeling like my afib was acting up. I took an extra metoprolol and it seemed to calm down a little but I have this fullness in my epigastric area, I'm a little sweaty and I feel nauseated"

## 2015-10-06 NOTE — ED Provider Notes (Signed)
CSN: [id number]     Arrival date & time 10/06/15  2213 History   First MD Initiated Contact with Patient 10/06/15 2240     Chief Complaint  Patient presents with  . Palpitations      HPI  Pt presents with palpitations.  History of "Lone A-Fib" with one prior episode of PAF.  On Flecanide 50 bid and Metoprolol.  Follows with Dr. Wilber Oliphantrotoiru of [facility] cardi[facility].  Pt describes An onset of tachycardia palpitations or to previous episode of A. fib. She took her evening flecainide, and an x-ray dose of her 25 of Toprol. States she feels better but still feels like her heart is "doing something different". Felt a fullness in her abdomen, felt some nausea. Also she had some diarrhea yesterday and today with areas that she may have a viral illness. No chest pain. No shortness of breath.  Past Medical History  Diagnosis Date  . PAF (paroxysmal atrial fibrillation) (HCC)   . Systemic hypertension   . Hyperlipidemia   . Obesity    Past Surgical History  Procedure Laterality Date  . Kidney stone surgery  1997  . [place] echocardiography  10/27/2011    trace TR, RV systolic function borderline reduced.  . Cardiovascular stress test  10/27/2011    Negative Bruce Protocol   Family History  Problem Relation Age of Onset  . Cancer Mother     breast  . Cancer Father     bladder  . Hypertension Father    Social History  Substance Use Topics  . Smoking status: Never Smoker   . Smokeless tobacco: None  . Alcohol Use: 0.0 oz/week    0 Standard drinks or equivalent per week     Comment: occas.   OB History    No data available     Review of Systems  Constitutional: Positive for diaphoresis. Negative for fever, chills, appetite change and fatigue.  HENT: Negative for mouth sores, sore throat and trouble swallowing.   Eyes: Negative for visual disturbance.  Respiratory: Negative for cough, chest tightness, shortness of breath and wheezing.   Cardiovascular: Positive for palpitations. Negative for  chest pain.  Gastrointestinal: Negative for nausea, vomiting, abdominal pain, diarrhea and abdominal distention.       Abdominal fullness  Endocrine: Negative for polydipsia, polyphagia and polyuria.  Genitourinary: Negative for dysuria, frequency and hematuria.  Musculoskeletal: Negative for gait problem.  Skin: Negative for color change, pallor and rash.  Neurological: Negative for dizziness, syncope, light-headedness and headaches.  Hematological: Does not bruise/bleed easily.  Psychiatric/Behavioral: Negative for behavioral problems and confusion.      Allergies  Atorvastatin  Home Medications   Prior to Admission medications   Medication Sig Start Date End Date Taking? Authorizing Provider  acetaminophen (TYLENOL) 500 MG tablet Take 1,000 mg by mouth every 6 (six) hours as needed for moderate pain.   Yes Historical Provider, MD  ALPRAZolam Prudy Feeler(XANAX) 0.25 MG tablet Take 0.25 mg by mouth at bedtime as needed for sleep.   Yes Historical Provider, MD  flecainide (TAMBOCOR) 50 MG tablet TAKE 1 TABLET BY MOUTH TWICE DAILY 09/14/15  Yes Lennette Biharihomas A Kelly, MD  metoprolol tartrate (LOPRESSOR) 25 MG tablet TAKE 1 TABLET BY MOUTH TWICE DAILY 09/14/15  Yes Lennette Biharihomas A Kelly, MD  sertraline (ZOLOFT) 25 MG tablet Take 12.5 mg by mouth daily.    Yes Historical Provider, MD   BP 128/63 mmHg  Pulse 78  Temp(Src) 98.4 F (36.9 C) (Oral)  Resp 16  SpO2  95% Physical Exam  Constitutional: She is oriented to person, place, and time. She appears well-developed and well-nourished. No distress.  HENT:  Head: Normocephalic.  Eyes: Conjunctivae are normal. Pupils are equal, round, and reactive to light. No scleral icterus.  Neck: Normal range of motion. Neck supple. No thyromegaly present.  Cardiovascular: Regular rhythm.  Tachycardia present.  Exam reveals no gallop and no friction rub.   No murmur heard. Regular, narrow, Undetermined rhythm.  Pulmonary/Chest: Effort normal and breath sounds normal. No  respiratory distress. She has no wheezes. She has no rales.  Abdominal: Soft. Bowel sounds are normal. She exhibits no distension. There is no tenderness. There is no rebound.  Musculoskeletal: Normal range of motion.  Neurological: She is alert and oriented to person, place, and time.  Skin: Skin is warm and dry. No rash noted.  Psychiatric: She has a normal mood and affect. Her behavior is normal.    ED Course  Procedures (including critical care time) Labs Review Labs Reviewed  BASIC METABOLIC PANEL - Abnormal; Notable for the following:    CO2 19 (*)    Glucose, Bld 161 (*)    All other components within normal limits  CBC  I-STAT TROPOININ, ED  I-STAT TROPOININ, ED  Rosezena Sensor, ED    Imaging Review Dg Chest 2 View  10/06/2015  CLINICAL DATA:  Atrial fibrillation and palpitations. EXAM: CHEST - 2 VIEW COMPARISON:  None FINDINGS: The heart size and mediastinal contours are within normal limits. Lung volumes are low bilaterally. There is no evidence of pulmonary edema, consolidation, pneumothorax, nodule or pleural fluid. There is visible gastric distention. The visualized skeletal structures are unremarkable. IMPRESSION: Low lung volumes.  Gastric distention. Electronically Signed   By: Irish Lack M.D.   On: 10/06/2015 23:21   I have personally reviewed and evaluated these images and lab results as part of my medical decision-making.   EKG Interpretation   Date/Time:  Thursday October 07 2015 00:16:41 EDT Ventricular Rate:  82 PR Interval:  190 QRS Duration: 110 QT Interval:  412 QTC Calculation: 481 R Axis:   76 Text Interpretation:  Sinus rhythm Inferior infarct, old Confirmed by  Fayrene Fearing  MD, Jakeim Sedore ([id number]) on 10/07/2015 12:28:35 AM      MDM   Final diagnoses:  Atrial flutter, unspecified type [facility])    Care discussed with cardiology, Dr. Onalee Hua. He agrees that EKG would appear to be 21 flutter, with a lower rate secondary to beta blockade, and flecainide. We  discussed extra dose of each medication. However, in the interval patient has converted. Normal first, and follow-up troponin. She remains a symptomatically after conversion. Plan will be routine follow-up with her physician, continue current medicines without changes.    Rolland Porter, MD 10/07/15 [phone]

## 2015-10-07 LAB — I-STAT TROPONIN, ED: Troponin i, poc: 0 ng/mL (ref 0.00–0.08)

## 2015-10-07 NOTE — ED Notes (Signed)
Visitor in hallway, "we need to leave, i have a deposition today at 9:30".

## 2015-10-07 NOTE — Discharge Instructions (Signed)
Atrial Flutter °Atrial flutter is a heart rhythm that can cause the heart to beat very fast (tachycardia). It originates in the upper chambers of the heart (atria). In atrial flutter, the top chambers of the heart (atria) often beat much faster than the bottom chambers of the heart (ventricles). Atrial flutter has a regular "saw toothed" appearance in an EKG readout. An EKG is a test that records the electrical activity of the heart. Atrial flutter can cause the heart to beat up to 150 beats per minute (BPM). Atrial flutter can either be short lived (paroxysmal) or permanent.  °CAUSES  °Causes of atrial flutter can be many. Some of these include: °· Heart related issues: °¨ Heart attack (myocardial infarction). °¨ Heart failure. °¨ Heart valve problems. °¨ Poorly controlled high blood pressure (hypertension). °¨ After open heart surgery. °· Lung related issues: °¨ A blood clot in the lungs (pulmonary embolism). °¨ Chronic obstructive pulmonary disease (COPD). Medications used to treat COPD can attribute to atrial flutter. °· Other related causes: °¨ Hyperthyroidism. °¨ Caffeine. °¨ Some decongestant cold medications. °¨ Low electrolyte levels such as potassium or magnesium. °¨ Cocaine. °SYMPTOMS °· An awareness of your heart beating rapidly (palpitations). °· Shortness of breath. °· Chest pain. °· Low blood pressure (hypotension). °· Dizziness or fainting. °DIAGNOSIS  °Different tests can be performed to diagnose atrial flutter.  °· An EKG. °· Holter monitor. This is a 24-hour recording of your heart rhythm. You will also be given a diary. Write down all symptoms that you have and what you were doing at the time you experienced symptoms. °· Cardiac event monitor. This small device can be worn for up to 30 days. When you have heart symptoms, you will push a button on the device. This will then record your heart rhythm. °· Echocardiogram. This is an imaging test to look at your heart. Your caregiver will look at your  heart valves and the ventricles. °· Stress test. This test can help determine if the atrial flutter is related to exercise or if coronary artery disease is present. °· Laboratory studies will look at certain blood levels like: °¨ Complete blood count (CBC). °¨ Potassium. °¨ Magnesium. °¨ Thyroid function. °TREATMENT  °Treatment of atrial flutter varies. A combination of therapies may be used or sometimes atrial flutter may need only 1 type of treatment.  °Lab work: °If your blood work, such as your electrolytes (potassium, magnesium) or your thyroid function tests, are abnormal, your caregiver will treat them accordingly.  °Medication:  °There are several different types of medications that can convert your heart to a normal rhythm and prevent atrial flutter from reoccurring.  °Nonsurgical procedures: °Nonsurgical techniques may be used to control atrial flutter. Some examples include: °· Cardioversion. This technique uses either drugs or an electrical shock to restore a normal heart rhythm: °¨ Cardioversion drugs may be given through an intravenous (IV) line to help "reset" the heart rhythm. °¨ In electrical cardioversion, your caregiver shocks your heart with electrical energy. This helps to reset the heartbeat to a normal rhythm. °· Ablation. If atrial flutter is a persistent problem, an ablation may be needed. This procedure is done under mild sedation. High frequency radio-wave energy is used to destroy the area of heart tissue responsible for atrial flutter. °SEEK IMMEDIATE MEDICAL CARE IF:  °You have: °· Dizziness. °· Near fainting or fainting. °· Shortness of breath. °· Chest pain or pressure. °· Sudden nausea or vomiting. °· Profuse sweating. °If you have the above symptoms,   call your local emergency service immediately! Do not drive yourself to the hospital. °MAKE SURE YOU:  °· Understand these instructions. °· Will watch your condition. °· Will get help right away if you are not doing well or get worse. °   °This information is not intended to replace advice given to you by your health care provider. Make sure you discuss any questions you have with your health care provider. °  °Document Released: 11/05/2008 Document Revised: 07/10/2014 Document Reviewed: 01/01/2015 °Elsevier Interactive Patient Education ©2016 [employer] Inc[employer] ° °

## 2015-10-14 ENCOUNTER — Ambulatory Visit (INDEPENDENT_AMBULATORY_CARE_PROVIDER_SITE_OTHER): Payer: 59 | Admitting: Cardiology

## 2015-10-14 ENCOUNTER — Encounter: Payer: Self-pay | Admitting: Cardiology

## 2015-10-14 VITALS — BP 120/80 | HR 63 | Ht 67.0 in | Wt 218.6 lb

## 2015-10-14 DIAGNOSIS — E669 Obesity, unspecified: Secondary | ICD-10-CM | POA: Diagnosis not present

## 2015-10-14 DIAGNOSIS — I480 Paroxysmal atrial fibrillation: Secondary | ICD-10-CM

## 2015-10-14 MED ORDER — FLECAINIDE ACETATE 50 MG PO TABS: 50.0000 mg | ORAL_TABLET | Freq: Two times a day (BID) | ORAL | Status: DC

## 2015-10-14 MED ORDER — METOPROLOL TARTRATE 25 MG PO TABS: 25.0000 mg | ORAL_TABLET | Freq: Two times a day (BID) | ORAL | Status: DC

## 2015-10-14 NOTE — Progress Notes (Signed)
10/14/2015 Brandi Waters   10-08-1955  [id number]  Primary Physician Thora Lance, MD Primary Cardiologist: Dr Royann Shivers  HPI:  Pleasant 60 y/o female followed by Dr Royann Shivers with PAF. She is CHADs VASc=1 (for sex) and is on ASA. She has no structural heart disease. She is overweight and admits she snores but she denies daytime fatigue and feels rested in the morning. Her husband has sleep apnea so she is familiar with the signs and symptoms. She has been holding NSR on low dose Flecainide and Metoprolol. On 10/06/15 she went out to dinner with her husband. After a glass of wine she noted tachycardia. She took an extra Metoprolol but the tachycardia persisted and she went to the ED. In the ED her EKG showed A flutter with slow VR-112. She converted spontaneously to NSR. She is in the office today for follow up. She has had no recurrences.    Current Outpatient Prescriptions  Medication Sig Dispense Refill  . acetaminophen (TYLENOL) 500 MG tablet Take 1,000 mg by mouth every 6 (six) hours as needed for moderate pain.    Marland Kitchen ALPRAZolam (XANAX) 0.25 MG tablet Take 0.25 mg by mouth at bedtime as needed for sleep.    . flecainide (TAMBOCOR) 50 MG tablet TAKE 1 TABLET BY MOUTH TWICE DAILY 60 tablet 0  . metoprolol tartrate (LOPRESSOR) 25 MG tablet TAKE 1 TABLET BY MOUTH TWICE DAILY 60 tablet 0  . sertraline (ZOLOFT) 25 MG tablet Take 12.5 mg by mouth daily.      No current facility-administered medications for this visit.    Allergies  Allergen Reactions  . Atorvastatin     GL upset    Social History   Social History  . Marital Status: Married    Spouse Name: N/A  . Number of Children: N/A  . Years of Education: N/A   Occupational History  . Not on file.   Social History Main Topics  . Smoking status: Never Smoker   . Smokeless tobacco: Not on file  . Alcohol Use: 0.0 oz/week    0 Standard drinks or equivalent per week     Comment: occas.  . Drug Use: No  . Sexual  Activity: Not on file   Other Topics Concern  . Not on file   Social History Narrative     Review of Systems: General: negative for chills, fever, night sweats or weight changes.  Cardiovascular: negative for chest pain, dyspnea on exertion, edema, orthopnea, palpitations, paroxysmal nocturnal dyspnea or shortness of breath Dermatological: negative for rash Respiratory: negative for cough or wheezing Urologic: negative for hematuria Abdominal: negative for nausea, vomiting, diarrhea, bright red blood per rectum, melena, or hematemesis Neurologic: negative for visual changes, syncope, or dizziness All other systems reviewed and are otherwise negative except as noted above.    Blood pressure 120/80, pulse 63, height  (1.702 m), weight 218 lb 9.6 oz (99.156 kg), SpO2 96 %.  General appearance: alert, cooperative, no distress and mildly obese Lungs: clear to auscultation bilaterally Heart: regular rate and rhythm Extremities: no edema Neurologic: Grossly normal  EKG NSR, HR 62, QTc-456  ASSESSMENT AND PLAN:   PAF (paroxysmal atrial fibrillation) (HCC) CHADs VASc= 1 (sex)- on ASA. NSR on Flecainide Recent breakthrough PAF- seen in ED 10/06/15, converted spontaneously  Obesity (BMI 30-39.9) BMI 34   PLAN  Same Rx for now. If she has another episode we could consider increasing her Flecainide. She admitted she had not taken her Flecainide at her  usual time 4/5 and I suggested she be careful about that and try and take as close to Q 12hr as possible. F/U Dr C in 6 months.   Brandi Waters K PA-C 10/14/2015 9:02 AM

## 2015-10-14 NOTE — Assessment & Plan Note (Signed)
CHADs VASc= 1 (sex)- on ASA. NSR on Flecainide Recent breakthrough PAF- seen in ED 10/06/15, converted spontaneously

## 2015-10-14 NOTE — Patient Instructions (Signed)
Your physician recommends that you schedule a follow-up appointment in: 6 Months with Dr Royann Shiversroitoru

## 2015-10-14 NOTE — Assessment & Plan Note (Signed)
BMI 34 

## 2015-12-07 ENCOUNTER — Telehealth: Payer: Self-pay | Admitting: Cardiovascular Disease

## 2015-12-07 NOTE — Telephone Encounter (Signed)
Patient called in regarding medication question.  She has been seeing an orthopedist for plantar fascitis and was Rx'ed Vimovo 500mg  BID She states she wanted to get the OK from Dr. Salena Saner before she started on this medication  She is concerned as she has AF and she read that this medication can cause stroke  Informed patient I will route the message to Dr. Salena Saner & our clinical pharmacy staff for med review and she will be contacted with their advice accordingly.

## 2015-12-07 NOTE — Telephone Encounter (Signed)
Patient called with MD advice. She voiced understanding 

## 2015-12-07 NOTE — Telephone Encounter (Signed)
Unfortunately all the medications in the NSAID class can cause a slightly increased risk of stroke and heart attack since they increase clotting to a small degree. The ingredient in this medication is Naproxen (same as Aleve), with a relatively small risk of clotting, especially if taken for a relatively short period of time. The other ingredient in her medication is esomeprazole (Nexium).

## 2016-01-21 ENCOUNTER — Encounter (INDEPENDENT_AMBULATORY_CARE_PROVIDER_SITE_OTHER): Payer: 59 | Admitting: Ophthalmology

## 2016-01-21 DIAGNOSIS — H2513 Age-related nuclear cataract, bilateral: Secondary | ICD-10-CM | POA: Diagnosis not present

## 2016-01-21 DIAGNOSIS — H35371 Puckering of macula, right eye: Secondary | ICD-10-CM

## 2016-01-21 DIAGNOSIS — H43813 Vitreous degeneration, bilateral: Secondary | ICD-10-CM

## 2016-03-30 ENCOUNTER — Other Ambulatory Visit: Payer: Self-pay | Admitting: Family Medicine

## 2016-03-30 DIAGNOSIS — R42 Dizziness and giddiness: Secondary | ICD-10-CM

## 2016-04-11 ENCOUNTER — Ambulatory Visit
Admission: RE | Admit: 2016-04-11 | Discharge: 2016-04-11 | Disposition: A | Discharge status: Home / Self Care | Payer: 59 | Source: Ambulatory Visit | Attending: Family Medicine | Admitting: Family Medicine

## 2016-04-11 DIAGNOSIS — R42 Dizziness and giddiness: Secondary | ICD-10-CM

## 2016-05-09 NOTE — Progress Notes (Signed)
Patient ID: Brandi Waters, female   DOB: [birth year], 60 y.o.   MRN: [id number]    Cardiology Office Note    Date:  05/10/2016   ID:  Brandi Waters, DOB [birth year], MRN [id number]  PCP:  Thora LanceEHINGER,ROBERT R, MD  Cardiologist:   Thurmon FairMihai Aasha Dina, MD   Chief Complaint  Patient presents with  . Follow-up    pt c/o that she can hear her heartbeat in her ear.     History of Present Illness:  Brandi LucksMarsha M Valvano is a 60 y.o. female with a history of "lone" atrial fibrillation in the absence of other cardiac abnormalities. She had normal echocardiogram and stress test in the past. She has a good response to flecainide/beta blocker antiarrhythmic therapy.  She required ER evaluation in April for an episode of atrial flutter with mild rapid ventricular rate that resolved spontaneously during ER observation. May have been related to delayed administration of her usual flecainide dose that day.She also had a single glass of red wine that night.  Since then she has not had any noticeable palpitations. She sometimes hears her heart beating in her ears at night, but it is slow and regular.  She has not had any bleeding or neurological events. She denies exertional dyspnea, angina, leg edema, claudication, syncope, dizziness/lightheadedness.  There is a diagnosis of hypertension, but this is a questionable diagnosis. Her blood pressure is very normal on a nominal dose of metoprolol.  Past Medical History:  Diagnosis Date  . Hyperlipidemia   . Obesity   . PAF (paroxysmal atrial fibrillation) (HCC)   . Systemic hypertension     Past Surgical History:  Procedure Laterality Date  . CARDIOVASCULAR STRESS TEST  10/27/2011   Negative Bruce Protocol  . KIDNEY STONE SURGERY  1997  . US ECHOCARDIOGRAPHY  10/27/2011   trace TR, RV systolic function borderline reduced.    Current Medications: Outpatient Medications Prior to Visit  Medication Sig Dispense Refill  . acetaminophen (TYLENOL) 500 MG tablet Take  1,000 mg by mouth every 6 (six) hours as needed for moderate pain.    Marland Kitchen. ALPRAZolam (XANAX) 0.25 MG tablet Take 0.25 mg by mouth at bedtime as needed for sleep.    . flecainide (TAMBOCOR) 50 MG tablet Take 1 tablet (50 mg total) by mouth 2 (two) times daily. 60 tablet 11  . metoprolol tartrate (LOPRESSOR) 25 MG tablet Take 1 tablet (25 mg total) by mouth 2 (two) times daily. 60 tablet 11  . sertraline (ZOLOFT) 25 MG tablet Take 12.5 mg by mouth daily.      No facility-administered medications prior to visit.      Allergies:   Atorvastatin   Social History   Social History  . Marital status: Married    Spouse name: N/A  . Number of children: N/A  . Years of education: N/A   Social History Main Topics  . Smoking status: Never Smoker  . Smokeless tobacco: None  . Alcohol use 0.0 oz/week     Comment: occas.  . Drug use: No  . Sexual activity: Not Asked   Other Topics Concern  . None   Social History Narrative  . None     Family History:  The patient's family history includes Cancer in her father and mother; Hypertension in her father.   ROS:   Please see the history of present illness.    ROS All other systems reviewed and are negative.   PHYSICAL EXAM:   VS:  BP 130/86 (BP  Location: Right Arm, Patient Position: Sitting, Cuff Size: Normal)   Pulse 70   Ht 5\' 7"  (1.702 m)   Wt 225 lb 9.6 oz (102.3 kg)   BMI 35.33 kg/m    GEN: Well nourished, well developed, in no acute distress  HEENT: normal  Neck: no JVD, carotid bruits, or masses Cardiac: RRR; no murmurs, rubs, or gallops,no edema  Respiratory:  clear to auscultation bilaterally, normal work of breathing GI: soft, nontender, nondistended, + BS MS: no deformity or atrophy  Skin: warm and dry, no rash Neuro:  Alert and Oriented x 3, Strength and sensation are intact Psych: euthymic mood, full affect  Wt Readings from Last 3 Encounters:  05/10/16 225 lb 9.6 oz (102.3 kg)  10/14/15 218 lb 9.6 oz (99.2 kg)    10/01/15 221 lb 9.6 oz (100.5 kg)      Studies/Labs Reviewed:   EKG:  EKG is ordered today.  The ekg ordered today demonstrates Sinus rhythm, minor IVCD, incomplete right bundle branch block, QRS 100 ms, QTC 473 ms. she has had a broad QRS, but the QT has increased compared to last year.   ASSESSMENT:    1. PAF (paroxysmal atrial fibrillation) (HCC)   2. Obesity (BMI 30-39.9)   3. Encounter for monitoring flecainide therapy      PLAN:  In order of problems listed above:  1. AFib: Good control on flecainide without significant side effects. No known structural heart disease and no symptoms of cardiac illness. Consider repeating a treadmill stress test next year on flecainide. Her embolic risk is low (really her only risk factor is her gender, CHADSvasc 0-1). She should take aspirin 81 mg daily.  2. She is struggling with attempts at weight loss. Recommended a low carbohydrate diet and increased physical activity. 3. Suspect slight QT interval prolongation related to antiarrhythmic therapy. Labs in April were essentially normal. Need to get more recent lab results from PCP. Will need periodic reassessment for evidence of CAD. Consider doing a treadmill stress test next year    Medication Adjustments/Labs and Tests Ordered: Current medicines are reviewed at length with the patient today.  Concerns regarding medicines are outlined above.  Medication changes, Labs and Tests ordered today are listed in the Patient Instructions below. Patient Instructions  Dr Royann Shiversroitoru recommends that you schedule a follow-up appointment in 1 year. You will receive a reminder letter in the mail two months in advance. If you don't receive a letter, please call our office to schedule the follow-up appointment.  If you need a refill on your cardiac medications before your next appointment, please call your pharmacy.    Signed, Thurmon FairMihai Candon Caras, MD  05/10/2016 8:50 AM    [facility]  [facility] [address], [place], [place]  [address] Phone: [phone]) [phone]; Fax: [phone]) [phone]

## 2016-05-10 ENCOUNTER — Encounter: Payer: Self-pay | Admitting: Cardiovascular Disease

## 2016-05-10 ENCOUNTER — Ambulatory Visit (INDEPENDENT_AMBULATORY_CARE_PROVIDER_SITE_OTHER): Payer: 59 | Admitting: Cardiovascular Disease

## 2016-05-10 VITALS — BP 130/86 | HR 70 | Ht 67.0 in | Wt 225.6 lb

## 2016-05-10 DIAGNOSIS — Z79899 Other long term (current) drug therapy: Secondary | ICD-10-CM | POA: Diagnosis not present

## 2016-05-10 DIAGNOSIS — Z5181 Encounter for therapeutic drug level monitoring: Secondary | ICD-10-CM | POA: Diagnosis not present

## 2016-05-10 DIAGNOSIS — I480 Paroxysmal atrial fibrillation: Secondary | ICD-10-CM

## 2016-05-10 DIAGNOSIS — E669 Obesity, unspecified: Secondary | ICD-10-CM | POA: Diagnosis not present

## 2016-05-10 NOTE — Patient Instructions (Signed)
Dr Croitoru recommends that you schedule a follow-up appointment in 1 year. You will receive a reminder letter in the mail two months in advance. If you don't receive a letter, please call our office to schedule the follow-up appointment.  If you need a refill on your cardiac medications before your next appointment, please call your pharmacy. 

## 2016-09-21 DIAGNOSIS — H35371 Puckering of macula, right eye: Secondary | ICD-10-CM | POA: Diagnosis not present

## 2016-09-29 ENCOUNTER — Other Ambulatory Visit: Payer: Self-pay | Admitting: Cardiology

## 2016-09-29 NOTE — Telephone Encounter (Signed)
Rx request sent to pharmacy.  

## 2017-02-05 DIAGNOSIS — R42 Dizziness and giddiness: Secondary | ICD-10-CM | POA: Diagnosis not present

## 2017-04-02 DIAGNOSIS — H35373 Puckering of macula, bilateral: Secondary | ICD-10-CM | POA: Diagnosis not present

## 2017-04-09 DIAGNOSIS — Z23 Encounter for immunization: Secondary | ICD-10-CM | POA: Diagnosis not present

## 2017-04-16 DIAGNOSIS — E7800 Pure hypercholesterolemia, unspecified: Secondary | ICD-10-CM | POA: Diagnosis not present

## 2017-04-16 DIAGNOSIS — I480 Paroxysmal atrial fibrillation: Secondary | ICD-10-CM | POA: Diagnosis not present

## 2017-05-02 ENCOUNTER — Other Ambulatory Visit: Payer: Self-pay | Admitting: Cardiology

## 2017-05-02 NOTE — Telephone Encounter (Signed)
REFILL 

## 2017-06-05 ENCOUNTER — Other Ambulatory Visit: Payer: Self-pay | Admitting: Cardiology

## 2017-07-01 ENCOUNTER — Other Ambulatory Visit: Payer: Self-pay | Admitting: Cardiovascular Disease

## 2017-07-27 DIAGNOSIS — Z86010 Personal history of colonic polyps: Secondary | ICD-10-CM | POA: Diagnosis not present

## 2017-07-30 ENCOUNTER — Other Ambulatory Visit: Payer: Self-pay | Admitting: Cardiovascular Disease

## 2017-07-30 NOTE — Telephone Encounter (Signed)
Rx(s) sent to pharmacy electronically.  

## 2017-08-23 ENCOUNTER — Other Ambulatory Visit: Payer: Self-pay | Admitting: Cardiovascular Disease

## 2017-08-23 NOTE — Telephone Encounter (Signed)
REFILL 

## 2017-09-07 ENCOUNTER — Other Ambulatory Visit: Payer: Self-pay | Admitting: Cardiovascular Disease

## 2017-09-11 ENCOUNTER — Other Ambulatory Visit: Payer: Self-pay | Admitting: Cardiovascular Disease

## 2017-09-11 MED ORDER — METOPROLOL TARTRATE 25 MG PO TABS: 25.0000 mg | ORAL_TABLET | Freq: Two times a day (BID) | ORAL | 0 refills | Status: DC

## 2017-09-11 NOTE — Telephone Encounter (Signed)
New Message    *STAT* If patient is at the pharmacy, call can be transferred to refill team.   1. Which medications need to be refilled? (please list name of each medication and dose if known) metoprolol tartrate (LOPRESSOR) 25 MG tablet  2. Which pharmacy/location (including street and city if local pharmacy) is medication to be sent to? [facility] [place]  3. Do they need a 30 day or 90 day supply? 30

## 2017-09-13 ENCOUNTER — Encounter: Payer: Self-pay | Admitting: Cardiology

## 2017-09-13 ENCOUNTER — Ambulatory Visit: Payer: 59 | Admitting: Cardiology

## 2017-09-13 VITALS — BP 132/82 | HR 64 | Ht 67.0 in | Wt 227.0 lb

## 2017-09-13 DIAGNOSIS — I480 Paroxysmal atrial fibrillation: Secondary | ICD-10-CM | POA: Diagnosis not present

## 2017-09-13 DIAGNOSIS — E669 Obesity, unspecified: Secondary | ICD-10-CM

## 2017-09-13 DIAGNOSIS — R400 Somnolence: Secondary | ICD-10-CM | POA: Diagnosis not present

## 2017-09-13 DIAGNOSIS — Z72820 Sleep deprivation: Secondary | ICD-10-CM

## 2017-09-13 MED ORDER — FLECAINIDE ACETATE 50 MG PO TABS: 50.0000 mg | ORAL_TABLET | Freq: Two times a day (BID) | ORAL | 2 refills | Status: DC

## 2017-09-13 MED ORDER — METOPROLOL TARTRATE 25 MG PO TABS: 25.0000 mg | ORAL_TABLET | Freq: Two times a day (BID) | ORAL | 2 refills | Status: DC

## 2017-09-13 NOTE — Progress Notes (Signed)
09/13/2017 Brandi LucksMarsha M Waters   09-15-1955  [id number]  Primary Physician Blair HeysEhinger, Robert, MD Primary Cardiologist: Dr Royann Shiversroitoru  HPI:  62 y/o female followed by Dr Royann Shiversroitoru with PAF. She is here for a one year check. She is CHADs VASc=0 (only risk factor is sex). She does not take ASA secondary to tinnitus in her Rt ear which she feels is worse with ASA. She has no structural heart disease. She is overweight and admits she snores but she. She admitted to some daytime fatigue and poor sleep. Her husband has sleep apnea so she is familiar with the signs and symptoms. She has declined sleep study in the past but wants to pursue this now.   She has been holding NSR on low dose Flecainide and Metoprolol. She denies any breakthrough PAF. "Its been an uneventful year" .     Current Outpatient Medications  Medication Sig Dispense Refill  . acetaminophen (TYLENOL) 500 MG tablet Take 1,000 mg by mouth every 6 (six) hours as needed for moderate pain.    Marland Kitchen. ALPRAZolam (XANAX) 0.25 MG tablet Take 0.25 mg by mouth at bedtime as needed for sleep.    . flecainide (TAMBOCOR) 50 MG tablet TAKE 1 TABLET BY MOUTH TWICE DAILY 30 tablet 0  . metoprolol tartrate (LOPRESSOR) 25 MG tablet Take 1 tablet (25 mg total) by mouth 2 (two) times daily. Please contact our office for an appointment for continuation of all heart medication refills 30 tablet 0  . sertraline (ZOLOFT) 25 MG tablet Take 12.5 mg by mouth daily.      No current facility-administered medications for this visit.     Allergies  Allergen Reactions  . Atorvastatin Nausea Only    GL upset    Past Medical History:  Diagnosis Date  . Hyperlipidemia   . Obesity   . PAF (paroxysmal atrial fibrillation) (HCC)   . Systemic hypertension     Social History   Socioeconomic History  . Marital status: Married    Spouse name: Not on file  . Number of children: Not on file  . Years of education: Not on file  . Highest education level: Not on file   Social Needs  . Financial resource strain: Not on file  . Food insecurity - worry: Not on file  . Food insecurity - inability: Not on file  . Transportation needs - medical: Not on file  . Transportation needs - non-medical: Not on file  Occupational History  . Not on file  Tobacco Use  . Smoking status: Never Smoker  . Smokeless tobacco: Never Used  Substance and Sexual Activity  . Alcohol use: Yes    Alcohol/week: 0.0 oz    Comment: occas.  . Drug use: No  . Sexual activity: Not on file  Other Topics Concern  . Not on file  Social History Narrative  . Not on file     Family History  Problem Relation Age of Onset  . Cancer Mother        breast  . Cancer Father        bladder  . Hypertension Father      Review of Systems: General: negative for chills, fever, night sweats or weight changes.  Cardiovascular: negative for chest pain, dyspnea on exertion, edema, orthopnea, palpitations, paroxysmal nocturnal dyspnea or shortness of breath Dermatological: negative for rash Respiratory: negative for cough or wheezing Urologic: negative for hematuria Abdominal: negative for nausea, vomiting, diarrhea, bright red blood per rectum, melena, or hematemesis  Neurologic: negative for visual changes, syncope, or dizziness All other systems reviewed and are otherwise negative except as noted above.    Blood pressure 132/82, pulse 64, height 5\' 7"  (1.702 m), weight 227 lb (103 kg).  General appearance: alert, cooperative, no distress and moderately obese Neck: no carotid bruit and no JVD Lungs: clear to auscultation bilaterally Heart: regular rate and rhythm Extremities: extremities normal, atraumatic, no cyanosis or edema Skin: Skin color, texture, turgor normal. No rashes or lesions Neurologic: Grossly normal  EKG NSR-64. QTc 476  ASSESSMENT AND PLAN:   PAF (paroxysmal atrial fibrillation) (HCC)  NSR on Flecainide. Only risk for CVA is sex. She does not take ASA secondary  to tinitus  Obesity (BMI 30-39.9) BMI 35- possible sleep apnea by history    PLAN  I will ask Dr Royann Shivers about possible taking Plavix since she can't tolerate ASA. She knows next year she will be CHADS VASC 2 and we'll discuss anticoagulation. She has no history of any bleeding issues. I also suggested a sleep study and she wants to pursue this.   Corine Shelter PA-C 09/13/2017 9:52 AM

## 2017-09-13 NOTE — Assessment & Plan Note (Signed)
BMI 35- possible sleep apnea by history

## 2017-09-13 NOTE — Patient Instructions (Signed)
Medication Instructions:  Your physician recommends that you continue on your current medications as directed. Please refer to the Current Medication list given to you today.  Labwork: None   Testing/Procedures: Your physician has recommended that you have a sleep study. This test records several body functions during sleep, including: brain activity, eye movement, oxygen and carbon dioxide blood levels, heart rate and rhythm, breathing rate and rhythm, the flow of air through your mouth and nose, snoring, body muscle movements, and chest and belly movement. TEST HAS TO BE APPROVED BY INSURANCE ONCE TEST HAS BEEN APPROVED SOMEONE FROM OUR OFFICE WILL CONTACT YOU WITH THE NEXT STEPS.  Follow-Up: Your physician wants you to follow-up in: 12 months with Dr Royann Shiversroitoru. You will receive a reminder letter in the mail two months in advance. If you don't receive a letter, please call our office to schedule the follow-up appointment.  Any Other Special Instructions Will Be Listed Below (If Applicable).  If you need a refill on your cardiac medications before your next appointment, please call your pharmacy.

## 2017-09-13 NOTE — Progress Notes (Signed)
Rather than switch to plavix, it makes more sense to switch to DOAC now. MCr

## 2017-09-13 NOTE — Assessment & Plan Note (Signed)
NSR on Flecainide. Only risk for CVA is sex. She does not take ASA secondary to tinitus

## 2017-09-14 ENCOUNTER — Telehealth: Payer: Self-pay

## 2017-09-14 MED ORDER — APIXABAN 5 MG PO TABS: 5.0000 mg | ORAL_TABLET | Freq: Two times a day (BID) | ORAL | 5 refills | Status: DC

## 2017-09-14 MED ORDER — APIXABAN 5 MG PO TABS: 5.0000 mg | ORAL_TABLET | Freq: Two times a day (BID) | ORAL | 0 refills | Status: DC

## 2017-09-14 NOTE — Telephone Encounter (Signed)
-----   Message from Abelino DerrickLuke K Kilroy, [place]PA-C sent at 09/13/2017  5:07 PM EDT ----- Rhae [place] can you tell this pt Dr Royann Shiversroitoru wants her to start on Eliquis 5 mg BID  Thanks  Franky Mach[place]uke

## 2017-09-14 NOTE — Telephone Encounter (Signed)
Spoke with patient informed her that Dr Royann Shiversroitoru wanted her to start Eliquis 5mg  twice a day. Placed 30 day free trial card placed upfront. Patient voiced understanding.

## 2017-12-14 DIAGNOSIS — H35371 Puckering of macula, right eye: Secondary | ICD-10-CM | POA: Diagnosis not present

## 2018-02-05 ENCOUNTER — Other Ambulatory Visit: Payer: Self-pay

## 2018-02-05 ENCOUNTER — Telehealth: Payer: Self-pay | Admitting: *Deleted

## 2018-02-05 DIAGNOSIS — R400 Somnolence: Secondary | ICD-10-CM

## 2018-02-05 DIAGNOSIS — R0683 Snoring: Secondary | ICD-10-CM

## 2018-02-05 DIAGNOSIS — Z72820 Sleep deprivation: Secondary | ICD-10-CM

## 2018-02-05 NOTE — Telephone Encounter (Signed)
PA submitted to [employer] for in lab sleep study. 

## 2018-02-26 ENCOUNTER — Telehealth: Payer: Self-pay | Admitting: *Deleted

## 2018-02-26 ENCOUNTER — Other Ambulatory Visit: Payer: Self-pay | Admitting: Cardiology

## 2018-02-26 DIAGNOSIS — E669 Obesity, unspecified: Secondary | ICD-10-CM

## 2018-02-26 DIAGNOSIS — R400 Somnolence: Secondary | ICD-10-CM

## 2018-02-26 DIAGNOSIS — I480 Paroxysmal atrial fibrillation: Secondary | ICD-10-CM

## 2018-02-26 NOTE — Telephone Encounter (Signed)
[facility]. I will order and schedule HST and contact her tomorrow with the appointment details. Patient voiced understanding.

## 2018-02-26 NOTE — Telephone Encounter (Signed)
-----   Message from Lucita Ferraraierica T Young, [place] sent at 02/05/2018  9:54 AM EDT ----- Regarding: Orders placed Hey, A sleep study have been ordered for this patient.  Thanks,  Viacomee

## 2018-03-26 ENCOUNTER — Other Ambulatory Visit: Payer: Self-pay | Admitting: Cardiology

## 2018-04-11 DIAGNOSIS — H35371 Puckering of macula, right eye: Secondary | ICD-10-CM | POA: Diagnosis not present

## 2018-04-16 DIAGNOSIS — I480 Paroxysmal atrial fibrillation: Secondary | ICD-10-CM | POA: Diagnosis not present

## 2018-04-16 DIAGNOSIS — E7800 Pure hypercholesterolemia, unspecified: Secondary | ICD-10-CM | POA: Diagnosis not present

## 2018-05-25 ENCOUNTER — Other Ambulatory Visit: Payer: Self-pay | Admitting: Cardiology

## 2018-06-24 ENCOUNTER — Other Ambulatory Visit: Payer: Self-pay | Admitting: Cardiology

## 2018-07-04 DIAGNOSIS — H35371 Puckering of macula, right eye: Secondary | ICD-10-CM | POA: Diagnosis not present

## 2018-09-27 ENCOUNTER — Telehealth: Payer: Self-pay

## 2018-09-27 NOTE — Telephone Encounter (Signed)
TELEPHONE CALL NOTE  Brandi Waters has been deemed a candidate for a follow-up tele-health visit to limit community exposure during the Covid-19 pandemic. I spoke with the patient via phone to ensure availability of phone/video source, confirm preferred email & phone number, and discuss instructions and expectations.  I reminded Brandi Waters to be prepared with any vital sign and/or heart rhythm information that could potentially be obtained via home monitoring, at the time of her visit. I reminded Brandi Waters to expect a phone call at the time of her visit if her visit.  Did the patient verbally acknowledge consent to treatment? Brandi Waters, CMA 09/27/2018 4:49 PM   DOWNLOADING THE WEBEX SOFTWARE TO SMARTPHONE  - If Apple, go to [employer] and type in WebEx in the search bar. Download [employer], the blue/green circle. The app is free but as with any other app downloads, their phone may require them to verify saved payment information or Apple password. The patient does NOT have to create an account.  - If Android, ask patient to go to [employer] and type in WebEx in the search bar. Download [employer], the blue/green circle. The app is free but as with any other app downloads, their phone may require them to verify saved payment information or Android password. The patient does NOT have to create an account.   CONSENT FOR TELE-HEALTH VISIT - PLEASE REVIEW  I hereby voluntarily request, consent and authorize [facility] and its employed or contracted physicians, physician assistants, nurse practitioners or other licensed health care professionals (the Practitioner), to provide me with telemedicine health care services (the "Services") as deemed necessary by the treating Practitioner. I acknowledge and consent to receive the Services by the Practitioner via telemedicine. I understand that the telemedicine visit will involve communicating  with the Practitioner through live audiovisual communication technology and the disclosure of certain medical information by electronic transmission. I acknowledge that I have been given the opportunity to request an in-person assessment or other available alternative prior to the telemedicine visit and am voluntarily participating in the telemedicine visit.  I understand that I have the right to withhold or withdraw my consent to the use of telemedicine in the course of my care at any time, without affecting my right to future care or treatment, and that the Practitioner or I may terminate the telemedicine visit at any time. I understand that I have the right to inspect all information obtained and/or recorded in the course of the telemedicine visit and may receive copies of available information for a reasonable fee.  I understand that some of the potential risks of receiving the Services via telemedicine include:  Marland Kitchen Delay or interruption in medical evaluation due to technological equipment failure or disruption; . Information transmitted may not be sufficient (e.g. poor resolution of images) to allow for appropriate medical decision making by the Practitioner; and/or  . In rare instances, security protocols could fail, causing a breach of personal health information.  Furthermore, I acknowledge that it is my responsibility to provide information about my medical history, conditions and care that is complete and accurate to the best of my ability. I acknowledge that Practitioner's advice, recommendations, and/or decision may be based on factors not within their control, such as incomplete or inaccurate data provided by me or distortions of diagnostic images or specimens that may result from electronic transmissions. I understand that the practice of medicine  is not an Visual merchandiser and that Practitioner makes no warranties or guarantees regarding treatment outcomes. I acknowledge that I will receive a copy  of this consent concurrently upon execution via email to the email address I last provided but may also request a printed copy by calling the office of [facility].    I understand that my insurance will be billed for this visit.   I have read or had this consent read to me. . I understand the contents of this consent, which adequately explains the benefits and risks of the Services being provided via telemedicine.  . I have been provided ample opportunity to ask questions regarding this consent and the Services and have had my questions answered to my satisfaction. . I give my informed consent for the services to be provided through the use of telemedicine in my medical care  By participating in this telemedicine visit I agree to the above.

## 2018-10-01 ENCOUNTER — Telehealth: Payer: Self-pay | Admitting: Cardiology

## 2018-10-01 ENCOUNTER — Encounter: Payer: Self-pay | Admitting: Cardiology

## 2018-10-01 ENCOUNTER — Other Ambulatory Visit: Payer: Self-pay

## 2018-10-01 DIAGNOSIS — Z7901 Long term (current) use of anticoagulants: Secondary | ICD-10-CM | POA: Insufficient documentation

## 2018-10-01 NOTE — Telephone Encounter (Signed)
Left message to call back to do visit pre-call to go over medications, allergies, and Family Hx. OK per DPR.

## 2018-10-01 NOTE — Telephone Encounter (Signed)
Patient called back, please return call

## 2018-10-02 ENCOUNTER — Encounter: Payer: Self-pay | Admitting: Cardiology

## 2018-10-02 ENCOUNTER — Telehealth: Payer: Self-pay | Admitting: Cardiology

## 2018-10-02 ENCOUNTER — Other Ambulatory Visit: Payer: Self-pay

## 2018-10-02 ENCOUNTER — Telehealth (INDEPENDENT_AMBULATORY_CARE_PROVIDER_SITE_OTHER): Payer: 59 | Admitting: Cardiology

## 2018-10-02 VITALS — HR 70 | Ht 67.0 in | Wt 222.0 lb

## 2018-10-02 DIAGNOSIS — Z7901 Long term (current) use of anticoagulants: Secondary | ICD-10-CM

## 2018-10-02 DIAGNOSIS — I480 Paroxysmal atrial fibrillation: Secondary | ICD-10-CM

## 2018-10-02 MED ORDER — APIXABAN 5 MG PO TABS: 5.0000 mg | ORAL_TABLET | Freq: Two times a day (BID) | ORAL | 2 refills | Status: DC

## 2018-10-02 NOTE — Telephone Encounter (Signed)
Pt had Telehealth visit with Corine Shelter, PA today.

## 2018-10-02 NOTE — Patient Instructions (Signed)
Medication Instructions:  Your physician recommends that you continue on your current medications as directed. Please refer to the Current Medication list given to you today. If you need a refill on your cardiac medications before your next appointment, please call your pharmacy.   Lab work: None  If you have labs (blood work) drawn today and your tests are completely normal, you will receive your results only by: . MyChart Message (if you have MyChart) OR . A paper copy in the mail If you have any lab test that is abnormal or we need to change your treatment, we will call you to review the results.  Testing/Procedures: None   Follow-Up: At [facility], you and your health needs are our priority.  As part of our continuing mission to provide you with exceptional heart care, we have created designated Provider Care Teams.  These Care Teams include your primary Cardiologist (physician) and Advanced Practice Providers (APPs -  Physician Assistants and Nurse Practitioners) who all work together to provide you with the care you need, when you need it. . Your physician recommends that you schedule a follow-up appointment in: 2 months with Luke Kilroy, PA-C  Any Other Special Instructions Will Be Listed Below (If Applicable).    

## 2018-10-02 NOTE — Telephone Encounter (Signed)
After review with Dr Royann Shivers I call the patient and reviewed his recommendations.  She is willing to start Eliquis.  Rx to be called in.   Corine Shelter PA-C 10/02/2018 1:59 PM

## 2018-10-02 NOTE — Progress Notes (Signed)
I'll let her know your thoughts but she probably won't start it.  We told her to start it last year, her PCP told her to start it in Nov and she is still not on it.  Corine Shelter PA-C 10/02/2018 1:45 PM

## 2018-10-02 NOTE — Progress Notes (Signed)
Virtual Visit via Telephone Note    Evaluation Performed:  Follow-up visit  This visit type was conducted due to national recommendations for restrictions regarding the COVID-19 Pandemic (e.g. social distancing).  This format is felt to be most appropriate for this patient at this time.  All issues noted in this document were discussed and addressed.  No physical exam was performed (except for noted visual exam findings with Video Visits).  Please refer to the patient's chart (MyChart message for video visits and phone note for telephone visits) for the patient's consent to telehealth for [facility].  Date:  10/02/2018   ID:  Brandi Waters, DOB 02/12/1956, MRN [id number]  Patient Location: Home [address] [place] [address]   Provider location:   [facility]  PCP:  Blair Heys, MD  Cardiologist:  Dr Royann Shivers Electrophysiologist:  None   Chief Complaint:  Yearly visit, discuss anticoagulation  History of Present Illness:    Brandi Waters is a 63 y.o. female who presents via audio/video conferencing for a telehealth visit today. Brandi Waters was contacted today by phone for her annual checkup.  She was unable to access WebEx video.  She has done well over the past year.  She denies any palpitations or tachycardia.  She does have regular follow-ups with her primary care provider.  Last year we had suggested she start Eliquis 5 mg twice daily.  She was hesitant to do this.  She also mentioned that her insurance would not cover it.  She discussed this with her primary care provider who also suggested based on her CHADS score that the benefits of Eliquis would outweigh the risk.  During the current COVID crisis she is reluctant to start Eliquis because she is concerned that if she had any bleeding issues she would end up at the hospital.  I told her I felt there was a low risk of bleeding with Eliquis but that this approach was not unreasonable.  I will schedule her for  a visit in 2 months with an EKG and at that time we will discuss Eliquis again.  In the past she has been unable to tolerate aspirin so she is not on that now either.  The patient does not symptoms concerning for COVID-19 infection (fever, chills, cough, or new SHORTNESS OF BREATH).    Prior CV studies:   The following studies were reviewed today:  EKG 09/13/17-  NSR-64. QTc 476  Past Medical History:  Diagnosis Date   Hyperlipidemia    Obesity    PAF (paroxysmal atrial fibrillation) (HCC)    Systemic hypertension    Past Surgical History:  Procedure Laterality Date   CARDIOVASCULAR STRESS TEST  10/27/2011   Negative Bruce Protocol   KIDNEY STONE SURGERY  1997   US ECHOCARDIOGRAPHY  10/27/2011   trace TR, RV systolic function borderline reduced.     Current Meds  Medication Sig   acetaminophen (TYLENOL) 500 MG tablet Take 1,000 mg by mouth every 6 (six) hours as needed for moderate pain.   ALPRAZolam (XANAX) 0.25 MG tablet Take 0.25 mg by mouth at bedtime as needed for sleep.   flecainide (TAMBOCOR) 50 MG tablet TAKE 1 TABLET(50 MG) BY MOUTH TWICE DAILY   metoprolol tartrate (LOPRESSOR) 25 MG tablet TAKE 1 TABLET(25 MG) BY MOUTH TWICE DAILY   sertraline (ZOLOFT) 25 MG tablet Take 12.5 mg by mouth daily.      Allergies:   Atorvastatin   Social History   Tobacco Use  Smoking status: Never Smoker   Smokeless tobacco: Never Used  Substance Use Topics   Alcohol use: Yes    Alcohol/week: 0.0 standard drinks    Comment: occas.   Drug use: No     Family Hx: The patient's family history includes Cancer in her father and mother; Hypertension in her father.  ROS:   Please see the history of present illness.    All other systems reviewed and are negative.   Labs/Other Tests and Data Reviewed:    Recent Labs: No results found for requested labs within last 8760 hours.   Recent Lipid Panel No results found for: CHOL, TRIG, HDL, CHOLHDL, LDLCALC,  LDLDIRECT  Wt Readings from Last 3 Encounters:  10/02/18 222 lb (100.7 kg)  09/13/17 227 lb (103 kg)  05/10/16 225 lb 9.6 oz (102.3 kg)     Exam:    Vital Signs:  Pulse 70    Ht 5\' 7"  (1.702 m)    Wt 222 lb (100.7 kg)    BMI 34.77 kg/m     ASSESSMENT & PLAN:    PAF- Holding NSR this past year by her history.  CHADS VASC-1 We had recommended she start Eliquis last year but she was hesitant and she doesn't want to start now during the current COVID pandemic.   COVID-19 Education: The signs and symptoms of COVID-19 were discussed with the patient and how to seek care for testing (follow up with PCP or arrange E-visit).  The importance of social distancing was discussed today.  Patient Risk:   After full review of this patients clinical status, I feel that they are at least moderate risk at this time.  Time:   Today, I have spent 15 minutes with the patient with telehealth technology discussing atrial fibrillation and anticoagulation.     Medication Adjustments/Labs and Tests Ordered: Current medicines are reviewed at length with the patient today.  Concerns regarding medicines are outlined above.  Tests Ordered: No orders of the defined types were placed in this encounter.  Medication Changes: No orders of the defined types were placed in this encounter.   Disposition:  F/U with me in two months with an EKG  Signed, Corine Shelter, PA-C  10/02/2018 10:29 AM    [facility]

## 2018-10-02 NOTE — Telephone Encounter (Signed)
Ms. Nappa was contacted today by phone for her annual checkup.  She was unable to access WebEx.  She has done well over the past year.  She denies any palpitations or tachycardia.  She does have regular follow-ups with her primary care provider.  Last year we had suggested she start Eliquis 5 mg twice daily.  She was hesitant to do this.  She also mentioned that her insurance would not cover it.  She discussed this with her primary care provider who also suggested based on her [facility] DS score that the benefits of Eliquis would outweigh the risk.  During the [place] V[place] crisis currently she is reluctant to start Eliquis because she is concerned if she had any bleeding issues she did end up at the hospital.  I told her I felt this was not unreasonable.  I will schedule her for a visit in 2 months with an EKG and at that time we will discuss Eliquis again.  In the past she has been unable to tolerate aspirin so she is not on that now either.  Corine Shelter PA-C 10/02/2018 10:24 AM

## 2018-10-02 NOTE — Telephone Encounter (Signed)
ty

## 2018-10-02 NOTE — Progress Notes (Signed)
Thanks, obviously I don't think COVID is a reason not to start the anticoagulant. Based on her CHADSVasc/HAS-BLED score, she has a 1.5% chance of coming to the hospital for serious bleeding on Eliquis, 3.2% chance of coming in for an embolic stroke off Eliquis. Her odds of staying out of the hospital are twice as good if she is on [employer]

## 2018-11-12 ENCOUNTER — Other Ambulatory Visit: Payer: Self-pay

## 2018-11-12 ENCOUNTER — Telehealth: Payer: Self-pay | Admitting: Cardiovascular Disease

## 2018-11-12 NOTE — Telephone Encounter (Signed)
Yes she is at risk, HTN and age.  A lot of it depends on what she does for work,  What preparations has her work provided, and how bad she needs to go back. If she needs a letter saying she is at risk we could provide that.  Corine Shelter PA-C 11/12/2018 4:05 PM

## 2018-11-12 NOTE — Telephone Encounter (Signed)
Letter wrote and patient notified and voiced understanding. I will ask the CMA in the office tomorrow to print and mail to patient.

## 2018-11-12 NOTE — Progress Notes (Signed)
Work note due to [employer]

## 2018-11-12 NOTE — Telephone Encounter (Signed)
New Message   Patient wants to talk to a nurse about whether or not she should return to work due to [place] and her being at an age that makes her susceptible to the virus.

## 2018-11-12 NOTE — Telephone Encounter (Signed)
Spoke with pt who state she is working remotely from home and inquiring if PA recommends that she continue to work remotely due to COVID because of her age and cardiac history.   Will route to PA

## 2018-11-12 NOTE — Telephone Encounter (Signed)
Pt updated and state she will need a letter for work. Ok to upload note to [employer].

## 2018-11-21 ENCOUNTER — Other Ambulatory Visit: Payer: Self-pay | Admitting: Cardiology

## 2018-11-29 ENCOUNTER — Telehealth: Payer: Self-pay | Admitting: Cardiology

## 2018-11-29 NOTE — Telephone Encounter (Signed)
smartphone/ can use ipad for video if need to/ consent/ my chart active/ pre reg completed

## 2018-12-03 ENCOUNTER — Encounter: Payer: Self-pay | Admitting: Cardiology

## 2018-12-03 ENCOUNTER — Telehealth (INDEPENDENT_AMBULATORY_CARE_PROVIDER_SITE_OTHER): Payer: 59 | Admitting: Cardiology

## 2018-12-03 DIAGNOSIS — I480 Paroxysmal atrial fibrillation: Secondary | ICD-10-CM

## 2018-12-04 NOTE — Progress Notes (Signed)
No show

## 2018-12-12 ENCOUNTER — Telehealth: Payer: Self-pay | Admitting: Physician Assistant

## 2018-12-12 ENCOUNTER — Telehealth: Payer: Self-pay | Admitting: Cardiovascular Disease

## 2018-12-12 NOTE — Telephone Encounter (Signed)
Called by patient about ongoing gum bleeding.   Telephone encounter from earlier in the day reviewed.  According to patient, after applying ice and pressure bleeding had initially stopped, but after 48mins - 1hour began again.  She denied lightheadedness, dizziness, orthostatic symptoms. Was not coughing or SOB. Saliva was blood tinged, but bleeding not copious.   Instructed patient to apply pressure for additional 10 minutes using washcloth and ice cube. Patient called back 30 minutes later to inform me that bleeding had stopped and had not recurred.   Told patient I would inform Dr. Lurline Del office of the nights events. She said she would reach out to his office in the morning.   Lauren K. Marletta Lor, MD Fellow, Cardiovascular Disease

## 2018-12-12 NOTE — Telephone Encounter (Signed)
Ms. Thackston called because she had some bleeding in her mouth this evening.  She ate some rice cakes today that were fairly rough and crunchy.  She stated that later, she realized that she had some bleeding around 1 of her molars and there was a clot which she removed.  After she removed the clot, it was bleeding more, she was concerned.  I requested that she try to put direct pressure on this and use ice as well to help stop the bleeding, she stated she would do so.  I told her that if she could not get the bleeding stopped in an hour or so, she should call back and we should discuss further what to do.  I advised that any injury she might get is going to take longer to get the bleeding stopped, especially in a vascular areas such as her mouth.  This is not a reason to stop the Eliquis, please continue it.  No other questions or concerns, if she has any additional problems, please call back.  Rosaria Ferries, PA-C 12/12/2018 7:54 PM Beeper [phone]]

## 2018-12-13 NOTE — Telephone Encounter (Signed)
Spoke with patient- yeah she states that the bleeding ended up stopping on its own without stopping the medication, just wanted to know if normal. I did advise patient that it can be common to have bleeding issues- but to call us if she ever has issues again. Patient verbalized understanding, thankful for call.

## 2018-12-13 NOTE — Telephone Encounter (Signed)
Patient states bleeding has stopped but she wants to know if the bleeding is normal( see previous phone conversation). What should she do.

## 2018-12-13 NOTE — Telephone Encounter (Signed)
OK to hold Eliquis for 24-48 h to allow bleeding to stop

## 2018-12-21 ENCOUNTER — Other Ambulatory Visit: Payer: Self-pay | Admitting: Cardiology

## 2019-01-17 ENCOUNTER — Telehealth: Payer: Self-pay | Admitting: Cardiovascular Disease

## 2019-01-17 NOTE — Telephone Encounter (Signed)
I called pt to confirm appt on 01-20-19. ° ° ° ° °.  ° ° °COVID-19 Pre-Screening Questions: ° °• In the past 7 to 10 days have you had a cough,  shortness of breath, headache, congestion, fever (100 or greater) body aches, chills, sore throat, or sudden loss of taste or sense of smell? no °• Have you been around anyone with known Covid 19. °• Have you been around anyone who is awaiting Covid 19 test results in the past 7 to 10 days? no °• Have you been around anyone who has been exposed to Covid 19, or has mentioned symptoms of Covid 19 within the past 7 to 10 days? no ° °If you have any concerns/questions about symptoms patients report during screening (either on the phone or at threshold). Contact the provider seeing the patient or DOD for further guidance.  If neither are available contact a member of the leadership team. ° ° ° °   ° ° ° ° ° °

## 2019-01-20 ENCOUNTER — Other Ambulatory Visit: Payer: Self-pay

## 2019-01-20 ENCOUNTER — Other Ambulatory Visit: Payer: Self-pay | Admitting: Cardiology

## 2019-01-20 ENCOUNTER — Encounter: Payer: Self-pay | Admitting: Cardiovascular Disease

## 2019-01-20 ENCOUNTER — Ambulatory Visit: Payer: 59 | Admitting: Cardiovascular Disease

## 2019-01-20 VITALS — BP 136/74 | HR 72 | Ht 67.0 in | Wt 220.0 lb

## 2019-01-20 DIAGNOSIS — E7800 Pure hypercholesterolemia, unspecified: Secondary | ICD-10-CM | POA: Diagnosis not present

## 2019-01-20 DIAGNOSIS — I480 Paroxysmal atrial fibrillation: Secondary | ICD-10-CM

## 2019-01-20 DIAGNOSIS — R002 Palpitations: Secondary | ICD-10-CM

## 2019-01-20 DIAGNOSIS — E669 Obesity, unspecified: Secondary | ICD-10-CM

## 2019-01-20 DIAGNOSIS — Z7901 Long term (current) use of anticoagulants: Secondary | ICD-10-CM

## 2019-01-20 LAB — BASIC METABOLIC PANEL
BUN/Creatinine Ratio: 25 (ref 12–28)
BUN: 17 mg/dL (ref 8–27)
CO2: 19 mmol/L — ABNORMAL LOW (ref 20–29)
Calcium: 9.6 mg/dL (ref 8.7–10.3)
Chloride: 102 mmol/L (ref 96–106)
Creatinine, Ser: 0.69 mg/dL (ref 0.57–1.00)
GFR calc Af Amer: 106 mL/min/{1.73_m2} (ref >59)
GFR calc non Af Amer: 92 mL/min/{1.73_m2} (ref >59)
Glucose: 107 mg/dL — ABNORMAL HIGH (ref 65–99)
Potassium: 3.9 mmol/L (ref 3.5–5.2)
Sodium: 138 mmol/L (ref 134–144)

## 2019-01-20 LAB — TSH: TSH: 1.92 u[IU]/mL (ref 0.450–4.500)

## 2019-01-20 LAB — MAGNESIUM: Magnesium: 2.1 mg/dL (ref 1.6–2.3)

## 2019-01-20 NOTE — Telephone Encounter (Signed)
Pt is a 63 yr old female who saw Dr. Orene Desanctis today, weight at that visit 99.8Kg and SCr today was 0.69, Will refill Eliquis 5mg  BID.

## 2019-01-20 NOTE — Patient Instructions (Signed)
Medication Instructions:  Your physician recommends that you continue on your current medications as directed. Please refer to the Current Medication list given to you today.  If you need a refill on your cardiac medications before your next appointment, please call your pharmacy.   Lab work: Your provider would like for you to have the following labs today: BMET, TSH and Magnesium  If you have labs (blood work) drawn today and your tests are completely normal, you will receive your results only by: [place] (if you have MyChart) OR A paper copy in the mail If you have any lab test that is abnormal or we need to change your treatment, we will call you to review the results.  Testing/Procedures: None ordered  Follow-Up: At [facility], you and your health needs are our priority.  As part of our continuing mission to provide you with exceptional heart care, we have created designated Provider Care Teams.  These Care Teams include your primary Cardiologist (physician) and Advanced Practice Providers (APPs -  Physician Assistants and Nurse Practitioners) who all work together to provide you with the care you need, when you need it. You will need a follow up appointment in 12 months.  Please call our office 2 months in advance to schedule this appointment.  You may see Sanda Klein, MD or one of the following Advanced Practice Providers on your designated Care Team: Almyra Deforest, PA-C Fabian Sharp, [place]

## 2019-01-20 NOTE — Progress Notes (Signed)
Patient ID: Brandi Waters, female   DOB: 1956/05/09, 63 y.o.   MRN: [id number]    Cardiology Office Note    Date:  01/20/2019   ID:  Brandi Waters, DOB 1956/05/09, MRN [id number]  PCP:  Brandi HeysEhinger, Robert, MD  Cardiologist:   Brandi FairMihai Latavious Bitter, MD   Chief Complaint  Patient presents with  . Atrial Fibrillation    History of Present Illness:  Brandi Waters M Waters is a 63 y.o. female with a history of "lone" atrial fibrillation in the absence of other cardiac abnormalities. She had normal echocardiogram and stress test in the past. She has a good response to flecainide/beta blocker antiarrhythmic therapy.  She has had a very good year with infrequent palpitations, until this past weekend where she seemed to have incessant palpitations on Friday and Saturday.  This is settled down.  She is in normal rhythm today.  She has not required any emergency evaluation for arrhythmia since April 2017.  Other than palpitations she does not have any troublesome symptoms.  She denies associated dizziness or syncope, dyspnea or chest pain.  She has lost a couple of pounds, but struggles to keep her weight down.  She does not have problems with leg edema.  She has not had any serious bleeding problems but when she had a small cut in her gum a few weeks ago took a while for the bleeding to stop.  She denies any falls or injuries.  She has no history of stroke or TIA and denies any recent focal neurological events.  Past Medical History:  Diagnosis Date  . Hyperlipidemia   . Obesity   . PAF (paroxysmal atrial fibrillation) (HCC)   . Systemic hypertension     Past Surgical History:  Procedure Laterality Date  . CARDIOVASCULAR STRESS TEST  10/27/2011   Negative Bruce Protocol  . KIDNEY STONE SURGERY  1997  . US ECHOCARDIOGRAPHY  10/27/2011   trace TR, RV systolic function borderline reduced.    Current Medications: Outpatient Medications Prior to Visit  Medication Sig Dispense Refill  . acetaminophen  (TYLENOL) 500 MG tablet Take 1,000 mg by mouth every 6 (six) hours as needed for moderate pain.    Marland Kitchen. ALPRAZolam (XANAX) 0.25 MG tablet Take 0.25 mg by mouth at bedtime as needed for sleep.    Marland Kitchen. apixaban (ELIQUIS) 5 MG TABS tablet Take 1 tablet (5 mg total) by mouth 2 (two) times daily. 60 tablet 2  . flecainide (TAMBOCOR) 50 MG tablet TAKE 1 TABLET(50 MG) BY MOUTH TWICE DAILY 180 tablet 3  . metoprolol tartrate (LOPRESSOR) 25 MG tablet TAKE 1 TABLET(25 MG) BY MOUTH TWICE DAILY 180 tablet 2  . sertraline (ZOLOFT) 25 MG tablet Take 12.5 mg by mouth daily.      No facility-administered medications prior to visit.      Allergies:   Atorvastatin   Social History   Socioeconomic History  . Marital status: Married    Spouse name: Not on file  . Number of children: Not on file  . Years of education: Not on file  . Highest education level: Not on file  Occupational History  . Not on file  Social Needs  . Financial resource strain: Not on file  . Food insecurity    Worry: Not on file    Inability: Not on file  . Transportation needs    Medical: Not on file    Non-medical: Not on file  Tobacco Use  . Smoking status: Never Smoker  .  Smokeless tobacco: Never Used  Substance and Sexual Activity  . Alcohol use: Yes    Alcohol/week: 0.0 standard drinks    Comment: occas.  . Drug use: No  . Sexual activity: Not on file  Lifestyle  . Physical activity    Days per week: Not on file    Minutes per session: Not on file  . Stress: Not on file  Relationships  . Social Herbalist on phone: Not on file    Gets together: Not on file    Attends religious service: Not on file    Active member of club or organization: Not on file    Attends meetings of clubs or organizations: Not on file    Relationship status: Not on file  Other Topics Concern  . Not on file  Social History Narrative  . Not on file     Family History:  The patient's family history includes Cancer in her father  and mother; Hypertension in her father.   ROS:   Please see the history of present illness.    ROS all other systems are reviewed and are negative  PHYSICAL EXAM:   VS:  BP 136/74   Pulse 72   Ht 5\' 7"  (1.702 m)   Wt 220 lb (99.8 kg)   SpO2 95%   BMI 34.46 kg/m     General: Alert, oriented x3, no distress, mildly obese Head: no evidence of trauma, PERRL, EOMI, no exophtalmos or lid lag, no myxedema, no xanthelasma; normal ears, nose and oropharynx Neck: normal jugular venous pulsations and no hepatojugular reflux; brisk carotid pulses without delay and no carotid bruits Chest: clear to auscultation, no signs of consolidation by percussion or palpation, normal fremitus, symmetrical and full respiratory excursions Cardiovascular: normal position and quality of the apical impulse, regular rhythm, normal first and second heart sounds, no murmurs, rubs or gallops Abdomen: no tenderness or distention, no masses by palpation, no abnormal pulsatility or arterial bruits, normal bowel sounds, no hepatosplenomegaly Extremities: no clubbing, cyanosis or edema; 2+ radial, ulnar and brachial pulses bilaterally; 2+ right femoral, posterior tibial and dorsalis pedis pulses; 2+ left femoral, posterior tibial and dorsalis pedis pulses; no subclavian or femoral bruits Neurological: grossly nonfocal Psych: Normal mood and affect   Wt Readings from Last 3 Encounters:  01/20/19 220 lb (99.8 kg)  10/02/18 222 lb (100.7 kg)  09/13/17 227 lb (103 kg)      Studies/Labs Reviewed:   EKG:  EKG is ordered today.  It shows sinus rhythm, minor intraventricular conduction delay (incomplete right bundle branch block), QRS 104 ms , QTc 462 ms   ASSESSMENT:    1. PAF (paroxysmal atrial fibrillation) ([place])   2. Chronic anticoagulation   3. Obesity (BMI 30-39.9)   4. Pure hypercholesterolemia   5. Palpitation      PLAN:  In order of problems listed above:  1. AFib: Good control on flecainide without  significant side effects. No known structural heart disease and no symptoms of cardiac illness.  Would like to repeat a treadmill stress test, but will delay 1 year until the coronavirus restrictions are less stringent.  Her embolic risk is relatively low, but high enough to justify anticoagulation (CHADSvasc 2, age , gender).  2. Anticoagulation: Tolerating Eliquis well without bleeding complications. 3. Obesity: Weight control remains an issue.  Discussed low carbohydrate diet and increased physical activity. 4. Flecainide: Repeat ECG today shows the same minimally prolonged QRS (100-110 ms), seen back as far  as 2014. Will need periodic reassessment for evidence of CAD. Consider doing a treadmill stress test next year    Medication Adjustments/Labs and Tests Ordered: Current medicines are reviewed at length with the patient today.  Concerns regarding medicines are outlined above.  Medication changes, Labs and Tests ordered today are listed in the Patient Instructions below. Patient Instructions  Medication Instructions:  Your physician recommends that you continue on your current medications as directed. Please refer to the Current Medication list given to you today.  If you need a refill on your cardiac medications before your next appointment, please call your pharmacy.   Lab work: Your provider would like for you to have the following labs today: BMET, TSH and Magnesium  If you have labs (blood work) drawn today and your tests are completely normal, you will receive your results only by: MyChart Message (if you have MyChart) OR A paper copy in the mail If you have any lab test that is abnormal or we need to change your treatment, we will call you to review the results.  Testing/Procedures: None ordered  Follow-Up: At [facility], you and your health needs are our priority.  As part of our continuing mission to provide you with exceptional heart care, we have created designated  Provider Care Teams.  These Care Teams include your primary Cardiologist (physician) and Advanced Practice Providers (APPs -  Physician Assistants and Nurse Practitioners) who all work together to provide you with the care you need, when you need it. You will need a follow up appointment in 12 months.  Please call our office 2 months in advance to schedule this appointment.  You may see Brandi FairMihai Delno Blaisdell, MD or one of the following Advanced Practice Providers on your designated Care Team: Azalee CourseHao Meng, PA-C Micah FlesherAngela Duke, [place]PA-C           Signed, Brandi FairMihai Ken[place]ha Doster, MD  01/20/2019 9:24 AM    [facility] [address], [place], [place]  [address] Phone: [phone]) [phone]; Fax: [phone]) [phone]

## 2019-01-28 ENCOUNTER — Ambulatory Visit: Payer: 59 | Admitting: Cardiology

## 2019-01-29 ENCOUNTER — Other Ambulatory Visit: Payer: Self-pay | Admitting: *Deleted

## 2019-01-29 MED ORDER — METOPROLOL TARTRATE 25 MG PO TABS: 25.0000 mg | ORAL_TABLET | Freq: Three times a day (TID) | ORAL | 3 refills | Status: DC

## 2019-03-17 ENCOUNTER — Other Ambulatory Visit: Payer: Self-pay

## 2019-03-17 ENCOUNTER — Encounter (INDEPENDENT_AMBULATORY_CARE_PROVIDER_SITE_OTHER): Payer: 59 | Admitting: Ophthalmology

## 2019-03-17 DIAGNOSIS — H35371 Puckering of macula, right eye: Secondary | ICD-10-CM | POA: Diagnosis not present

## 2019-03-17 DIAGNOSIS — H353121 Nonexudative age-related macular degeneration, left eye, early dry stage: Secondary | ICD-10-CM

## 2019-03-17 DIAGNOSIS — H43813 Vitreous degeneration, bilateral: Secondary | ICD-10-CM | POA: Diagnosis not present

## 2019-03-17 DIAGNOSIS — H2513 Age-related nuclear cataract, bilateral: Secondary | ICD-10-CM

## 2019-07-08 ENCOUNTER — Ambulatory Visit: Payer: BC Managed Care – PPO | Admitting: Cardiovascular Disease

## 2019-07-08 ENCOUNTER — Other Ambulatory Visit: Payer: Self-pay

## 2019-07-08 ENCOUNTER — Encounter: Payer: Self-pay | Admitting: Cardiovascular Disease

## 2019-07-08 VITALS — BP 148/82 | HR 73 | Ht 66.0 in | Wt 202.0 lb

## 2019-07-08 DIAGNOSIS — Z7901 Long term (current) use of anticoagulants: Secondary | ICD-10-CM

## 2019-07-08 DIAGNOSIS — I480 Paroxysmal atrial fibrillation: Secondary | ICD-10-CM

## 2019-07-08 DIAGNOSIS — E669 Obesity, unspecified: Secondary | ICD-10-CM | POA: Diagnosis not present

## 2019-07-08 DIAGNOSIS — Z79899 Other long term (current) drug therapy: Secondary | ICD-10-CM

## 2019-07-08 DIAGNOSIS — Z5181 Encounter for therapeutic drug level monitoring: Secondary | ICD-10-CM | POA: Diagnosis not present

## 2019-07-08 MED ORDER — METOPROLOL SUCCINATE ER 50 MG PO TB24: 50.0000 mg | ORAL_TABLET | Freq: Two times a day (BID) | ORAL | 11 refills | Status: DC

## 2019-07-08 NOTE — Patient Instructions (Signed)
Medication Instructions:  STOP the Metoprolol Tartrate START Metoprolol Succinate 50 mg twice daily  *If you need a refill on your cardiac medications before your next appointment, please call your pharmacy*  Lab Work: None ordered If you have labs (blood work) drawn today and your tests are completely normal, you will receive your results only by: Marland Kitchen MyChart Message (if you have MyChart) OR . A paper copy in the mail If you have any lab test that is abnormal or we need to change your treatment, we will call you to review the results.  Testing/Procedures: None ordered  Follow-Up: At [facility], you and your health needs are our priority.  As part of our continuing mission to provide you with exceptional heart care, we have created designated Provider Care Teams.  These Care Teams include your primary Cardiologist (physician) and Advanced Practice Providers (APPs -  Physician Assistants and Nurse Practitioners) who all work together to provide you with the care you need, when you need it.  Your next appointment:   12 month(s)  The format for your next appointment:   Either In Person or Virtual  Provider:   Dr. Royann Shivers

## 2019-07-08 NOTE — Progress Notes (Signed)
Patient ID: Brandi Waters, female   DOB: [birth year], 64 y.o.   MRN: [id number]    Cardiology Office Note    Date:  07/08/2019   ID:  Brandi Waters, Brandi Waters [birth year], MRN [id number]  PCP:  Blair Heys, MD  Cardiologist:   Thurmon Fair, MD   No chief complaint on file.   History of Present Illness:  Brandi Waters is a 64 y.o. female with a history of "lone" atrial fibrillation in the absence of other cardiac abnormalities. She had normal echocardiogram and stress test in the past. She has a good response to flecainide/beta blocker antiarrhythmic therapy.  She has had a complicated issue with cataract surgery and had very poor vision for a long time which caused anxiety and inability to work.  During that time she had increasing palpitations.  Her Kardia device shows that these are frequent PVCs, rather than recurrent atrial fibrillation.  She feels that the effect of her medication wears off too quickly.  Otherwise she has no cardiovascular complaints.  She has lost quite a bit of weight this year.  Had labs with Dr. Manus Gunning, shown on 1 occasion elevated glucose, but when repeated a few weeks later her fasting glucose was 99.  She reports that her total cholesterol is 101.  The patient specifically denies any chest pain at rest exertion, dyspnea at rest or with exertion, orthopnea, paroxysmal nocturnal dyspnea, syncope, palpitations, focal neurological deficits, intermittent claudication, lower extremity edema, unexplained weight gain, cough, hemoptysis or wheezing.  She has not had any bleeding problems.  She denies falls or injuries.  She has no history of stroke or TIA and denies any recent focal neurological events.  Her ECG today shows sinus rhythm with a QRS duration of 100 ms (actually shorter than previous ECGs which usually showed a QRS around 104-110 ms).  QTc was 462 ms.  Past Medical History:  Diagnosis Date  . Hyperlipidemia   . Obesity   . PAF (paroxysmal atrial  fibrillation) (HCC)   . Systemic hypertension     Past Surgical History:  Procedure Laterality Date  . CARDIOVASCULAR STRESS TEST  10/27/2011   Negative Bruce Protocol  . KIDNEY STONE SURGERY  1997  . US ECHOCARDIOGRAPHY  10/27/2011   trace TR, RV systolic function borderline reduced.    Current Medications: Outpatient Medications Prior to Visit  Medication Sig Dispense Refill  . acetaminophen (TYLENOL) 500 MG tablet Take 1,000 mg by mouth every 6 (six) hours as needed for moderate pain.    Marland Kitchen ALPRAZolam (XANAX) 0.25 MG tablet Take 0.25 mg by mouth at bedtime as needed for sleep.    Marland Kitchen ELIQUIS 5 MG TABS tablet TAKE 1 TABLET(5 MG) BY MOUTH TWICE DAILY 60 tablet 10  . flecainide (TAMBOCOR) 50 MG tablet TAKE 1 TABLET(50 MG) BY MOUTH TWICE DAILY 180 tablet 3  . metoprolol tartrate (LOPRESSOR) 25 MG tablet Take 1 tablet (25 mg total) by mouth 3 (three) times daily. 270 tablet 3  . sertraline (ZOLOFT) 25 MG tablet Take 12.5 mg by mouth daily.      No facility-administered medications prior to visit.     Allergies:   Atorvastatin   Social History   Socioeconomic History  . Marital status: Married    Spouse name: Not on file  . Number of children: Not on file  . Years of education: Not on file  . Highest education level: Not on file  Occupational History  . Not on file  Tobacco Use  .  Smoking status: Never Smoker  . Smokeless tobacco: Never Used  Substance and Sexual Activity  . Alcohol use: Yes    Alcohol/week: 0.0 standard drinks    Comment: occas.  . Drug use: No  . Sexual activity: Not on file  Other Topics Concern  . Not on file  Social History Narrative  . Not on file   Social Determinants of Health   Financial Resource Strain:   . Difficulty of Paying Living Expenses: Not on file  Food Insecurity:   . Worried About Programme researcher, broadcasting/film/video in the Last Year: Not on file  . Ran Out of Food in the Last Year: Not on file  Transportation Needs:   . Lack of Transportation  (Medical): Not on file  . Lack of Transportation (Non-Medical): Not on file  Physical Activity:   . Days of Exercise per Week: Not on file  . Minutes of Exercise per Session: Not on file  Stress:   . Feeling of Stress : Not on file  Social Connections:   . Frequency of Communication with Friends and Family: Not on file  . Frequency of Social Gatherings with Friends and Family: Not on file  . Attends Religious Services: Not on file  . Active Member of Clubs or Organizations: Not on file  . Attends Banker Meetings: Not on file  . Marital Status: Not on file     Family History:  The patient's family history includes Cancer in her father and mother; Hypertension in her father.   ROS:   Please see the history of present illness.    ROS all other systems are reviewed and are negative  PHYSICAL EXAM:   VS:  BP (!) 148/82   Pulse 73   Ht 5\' 6"  (1.676 m)   Wt 202 lb (91.6 kg)   SpO2 98%   BMI 32.60 kg/m      General: Alert, oriented x3, no distress, mildly obese Head: no evidence of trauma, PERRL, EOMI, no exophtalmos or lid lag, no myxedema, no xanthelasma; normal ears, nose and oropharynx Neck: normal jugular venous pulsations and no hepatojugular reflux; brisk carotid pulses without delay and no carotid bruits Chest: clear to auscultation, no signs of consolidation by percussion or palpation, normal fremitus, symmetrical and full respiratory excursions Cardiovascular: normal position and quality of the apical impulse, regular rhythm, normal first and second heart sounds, no murmurs, rubs or gallops Abdomen: no tenderness or distention, no masses by palpation, no abnormal pulsatility or arterial bruits, normal bowel sounds, no hepatosplenomegaly Extremities: no clubbing, cyanosis or edema; 2+ radial, ulnar and brachial pulses bilaterally; 2+ right femoral, posterior tibial and dorsalis pedis pulses; 2+ left femoral, posterior tibial and dorsalis pedis pulses; no  subclavian or femoral bruits Neurological: grossly nonfocal Psych: Normal mood and affect    Wt Readings from Last 3 Encounters:  01/20/19 220 lb (99.8 kg)  10/02/18 222 lb (100.7 kg)  09/13/17 227 lb (103 kg)      Studies/Labs Reviewed:   EKG:  EKG is ordered today.  Her ECG today shows sinus rhythm with a QRS duration of 100 ms .  Nonspecific T wave changes limited to lead V6. QTc was 462 ms.  ASSESSMENT:    No diagnosis found.   PLAN:  In order of problems listed above:  1. AFib: Overall excellent clinical response to flecainide.  Current palpitations seem to be related to PVCs and will switch to a longer acting metoprolol succinate for better, smoother 24-hour  coverage.  Needs to have a follow-up treadmill test but will delay this until the coronavirus pandemic is over.  (CHADSvasc 2, age , gender).  2. Anticoagulation: No bleeding problems. 3. Obesity: Congratulated on weight loss.  Remains mildly obese. 4. Flecainide: QRS is only borderline today at 100 ms, usually mildly (100-110 ms), seen back as far as 2014. Will need periodic reassessment for evidence of CAD. Consider doing a treadmill stress test next year. 5. Elevated BP: Atypical for her.  Very recently checked blood pressure was 121/78 in PCPs office.    Medication Adjustments/Labs and Tests Ordered: Current medicines are reviewed at length with the patient today.  Concerns regarding medicines are outlined above.  Medication changes, Labs and Tests ordered today are listed in the Patient Instructions below. There are no Patient Instructions on file for this visit.   Signed, Sanda Klein, MD  07/08/2019 8:45 AM    [facility] [address], [place], [place]  [address] Phone: [phone]; Fax: [phone]

## 2019-08-17 ENCOUNTER — Ambulatory Visit: Payer: BC Managed Care – PPO | Attending: Internal Medicine

## 2019-08-17 DIAGNOSIS — Z23 Encounter for immunization: Secondary | ICD-10-CM | POA: Insufficient documentation

## 2019-08-17 NOTE — Progress Notes (Signed)
   Covid-19 Vaccination Clinic  Name:  Brandi Waters    MRN: [id number] DOB: [birth year]  08/17/2019  Brandi Waters was observed post Covid-19 immunization for 15 minutes without incidence. She was provided with Vaccine Information Sheet and instruction to access the V-Safe system.   Brandi Waters was instructed to call 911 with any severe reactions post vaccine: Marland Kitchen Difficulty breathing  . Swelling of your face and throat  . A fast heartbeat  . A bad rash all over your body  . Dizziness and weakness    Immunizations Administered    Name Date Dose VIS Date Route   Pfizer COVID-19 Vaccine 08/17/2019  8:37 AM 0.3 mL 06/13/2019 Intramuscular   Manufacturer: [employer], [employer]   Lot: [id number]   NDC: [address]-6    2

## 2019-09-08 ENCOUNTER — Ambulatory Visit: Payer: BC Managed Care – PPO | Attending: Internal Medicine

## 2019-09-08 DIAGNOSIS — Z23 Encounter for immunization: Secondary | ICD-10-CM | POA: Insufficient documentation

## 2019-09-08 NOTE — Progress Notes (Signed)
   Covid-19 Vaccination Clinic  Name:  Brandi Waters    MRN: [id number] DOB: January 02, 1956  09/08/2019  [name] was observed post Covid-19 immunization for 15 minutes without incident. She was provided with Vaccine Information Sheet and instruction to access the V-Safe system.   [name] was instructed to call 911 with any severe reactions post vaccine: Marland Kitchen Difficulty breathing  . Swelling of face and throat  . A fast heartbeat  . A bad rash all over body  . Dizziness and weakness   Immunizations Administered    Name Date Dose VIS Date Route   Pfizer COVID-19 Vaccine 09/08/2019  5:24 PM 0.3 mL 06/13/2019 Intramuscular   Manufacturer: [employer], [employer]   Lot: [id number]   NDC: [address]-1

## 2019-09-29 DIAGNOSIS — H40013 Open angle with borderline findings, low risk, bilateral: Secondary | ICD-10-CM | POA: Diagnosis not present

## 2019-11-11 ENCOUNTER — Other Ambulatory Visit: Payer: Self-pay | Admitting: Cardiology

## 2019-12-16 ENCOUNTER — Other Ambulatory Visit: Payer: Self-pay | Admitting: Pharmacist

## 2019-12-16 MED ORDER — APIXABAN 5 MG PO TABS: 5.0000 mg | ORAL_TABLET | Freq: Two times a day (BID) | ORAL | 5 refills | Status: DC

## 2020-03-16 ENCOUNTER — Encounter (INDEPENDENT_AMBULATORY_CARE_PROVIDER_SITE_OTHER): Payer: 59 | Admitting: Ophthalmology

## 2020-03-16 ENCOUNTER — Other Ambulatory Visit: Payer: Self-pay

## 2020-03-16 DIAGNOSIS — H43813 Vitreous degeneration, bilateral: Secondary | ICD-10-CM

## 2020-03-16 DIAGNOSIS — H35371 Puckering of macula, right eye: Secondary | ICD-10-CM

## 2020-03-16 DIAGNOSIS — H353122 Nonexudative age-related macular degeneration, left eye, intermediate dry stage: Secondary | ICD-10-CM

## 2020-03-27 DIAGNOSIS — Z23 Encounter for immunization: Secondary | ICD-10-CM | POA: Diagnosis not present

## 2020-03-31 ENCOUNTER — Encounter (HOSPITAL_COMMUNITY): Payer: Self-pay | Admitting: Student

## 2020-03-31 ENCOUNTER — Other Ambulatory Visit: Payer: Self-pay

## 2020-03-31 ENCOUNTER — Emergency Department (HOSPITAL_COMMUNITY): Payer: BC Managed Care – PPO

## 2020-03-31 ENCOUNTER — Emergency Department (HOSPITAL_COMMUNITY)
Admission: EM | Admit: 2020-03-31 | Discharge: 2020-03-31 | Disposition: A | Discharge status: Home / Self Care | Payer: BC Managed Care – PPO | Attending: Emergency Medicine | Admitting: Emergency Medicine

## 2020-03-31 DIAGNOSIS — I959 Hypotension, unspecified: Secondary | ICD-10-CM | POA: Diagnosis not present

## 2020-03-31 DIAGNOSIS — S82891A Other fracture of right lower leg, initial encounter for closed fracture: Secondary | ICD-10-CM | POA: Diagnosis not present

## 2020-03-31 DIAGNOSIS — S82391A Other fracture of lower end of right tibia, initial encounter for closed fracture: Secondary | ICD-10-CM | POA: Diagnosis not present

## 2020-03-31 DIAGNOSIS — Z20822 Contact with and (suspected) exposure to covid-19: Secondary | ICD-10-CM | POA: Insufficient documentation

## 2020-03-31 DIAGNOSIS — Y9389 Activity, other specified: Secondary | ICD-10-CM | POA: Insufficient documentation

## 2020-03-31 DIAGNOSIS — S8291XA Unspecified fracture of right lower leg, initial encounter for closed fracture: Secondary | ICD-10-CM | POA: Diagnosis not present

## 2020-03-31 DIAGNOSIS — Z7901 Long term (current) use of anticoagulants: Secondary | ICD-10-CM | POA: Diagnosis not present

## 2020-03-31 DIAGNOSIS — W07XXXA Fall from chair, initial encounter: Secondary | ICD-10-CM | POA: Diagnosis not present

## 2020-03-31 DIAGNOSIS — R110 Nausea: Secondary | ICD-10-CM | POA: Diagnosis not present

## 2020-03-31 DIAGNOSIS — I10 Essential (primary) hypertension: Secondary | ICD-10-CM | POA: Insufficient documentation

## 2020-03-31 DIAGNOSIS — S99911A Unspecified injury of right ankle, initial encounter: Secondary | ICD-10-CM | POA: Diagnosis not present

## 2020-03-31 DIAGNOSIS — Z79899 Other long term (current) drug therapy: Secondary | ICD-10-CM | POA: Insufficient documentation

## 2020-03-31 DIAGNOSIS — S82831A Other fracture of upper and lower end of right fibula, initial encounter for closed fracture: Secondary | ICD-10-CM | POA: Diagnosis not present

## 2020-03-31 DIAGNOSIS — S82851A Displaced trimalleolar fracture of right lower leg, initial encounter for closed fracture: Secondary | ICD-10-CM | POA: Diagnosis not present

## 2020-03-31 DIAGNOSIS — R52 Pain, unspecified: Secondary | ICD-10-CM | POA: Diagnosis not present

## 2020-03-31 LAB — BASIC METABOLIC PANEL
Anion gap: 8 (ref 5–15)
BUN: 16 mg/dL (ref 8–23)
CO2: 24 mmol/L (ref 22–32)
Calcium: 8.5 mg/dL — ABNORMAL LOW (ref 8.9–10.3)
Chloride: 104 mmol/L (ref 98–111)
Creatinine, Ser: 0.6 mg/dL (ref 0.44–1.00)
GFR calc Af Amer: >60 mL/min (ref >60)
GFR calc non Af Amer: >60 mL/min (ref >60)
Glucose, Bld: 162 mg/dL — ABNORMAL HIGH (ref 70–99)
Potassium: 4.1 mmol/L (ref 3.5–5.1)
Sodium: 136 mmol/L (ref 135–145)

## 2020-03-31 LAB — RESPIRATORY PANEL BY RT PCR (FLU A&B, COVID)
Influenza A by PCR: NEGATIVE
Influenza B by PCR: NEGATIVE
SARS Coronavirus 2 by RT PCR: NEGATIVE

## 2020-03-31 LAB — CBC
HCT: 38.7 % (ref 36.0–46.0)
Hemoglobin: 12.6 g/dL (ref 12.0–15.0)
MCH: 29.9 pg (ref 26.0–34.0)
MCHC: 32.6 g/dL (ref 30.0–36.0)
MCV: 91.9 fL (ref 80.0–100.0)
Platelets: 151 10*3/uL (ref 150–400)
RBC: 4.21 MIL/uL (ref 3.87–5.11)
RDW: 13.8 % (ref 11.5–15.5)
WBC: 10.6 10*3/uL — ABNORMAL HIGH (ref 4.0–10.5)
nRBC: 0 % (ref 0.0–0.2)

## 2020-03-31 MED ORDER — PROPOFOL 10 MG/ML IV BOLUS
0.5000 mg/kg | Freq: Once | INTRAVENOUS | Status: AC
  Administered 2020-03-31: 47.7 mg via INTRAVENOUS
  Filled 2020-03-31: qty 20

## 2020-03-31 MED ORDER — FENTANYL CITRATE (PF) 100 MCG/2ML IJ SOLN
50.0000 ug | Freq: Once | INTRAMUSCULAR | Status: AC
  Administered 2020-03-31: 50 ug via INTRAVENOUS
  Filled 2020-03-31: qty 2

## 2020-03-31 MED ORDER — OXYCODONE-ACETAMINOPHEN 5-325 MG PO TABS: 1.0000 | ORAL_TABLET | Freq: Four times a day (QID) | ORAL | Status: DC | PRN

## 2020-03-31 MED ORDER — OXYCODONE-ACETAMINOPHEN 5-325 MG PO TABS: 1.0000 | ORAL_TABLET | Freq: Four times a day (QID) | ORAL | 0 refills | Status: DC | PRN

## 2020-03-31 NOTE — ED Notes (Signed)
X-ray at bedside

## 2020-03-31 NOTE — ED Triage Notes (Signed)
Patient BIB [facility] c/o fall with right ankle deformity. Patient fell from a counter height rolling chair. IV 20g right AC, patient received 100 mcg of fentanyl IV, 500cc NS, and 4mg  zofran IV. Patient is on blood thinner for AFIB, did not hit her head.  EMS vitals 140/102, 66 HR, 98% RA, and 97.5 temp.  CBG 137. EDP at bedside.

## 2020-03-31 NOTE — ED Provider Notes (Signed)
[facility]-EMERGENCY DEPT Provider Note   CSN: [id number] Arrival date & time: 03/31/20  1146     History No chief complaint on file.   Brandi Waters is a 64 y.o. female.  HPI     64 year old female history of paroxysmal A. fib, on anticoagulation, presents today complaining of right ankle pain and deformity.  She was sitting in a chair when she fell with her right leg going backwards.  She had obvious deformity and pain.  She denies any other injury.  She not strike her head and has no loss of consciousness or headache.  Pain is well controlled here with immobilization and pain medication given prehospital by EMS.  Past Medical History:  Diagnosis Date  . Hyperlipidemia   . Obesity   . PAF (paroxysmal atrial fibrillation) (HCC)   . Systemic hypertension     Patient Active Problem List   Diagnosis Date Noted  . Chronic anticoagulation 10/01/2018  . PAF (paroxysmal atrial fibrillation) (HCC) 09/26/2013  . Obesity (BMI 30-39.9) 09/26/2013  . Hyperlipidemia 09/26/2013    Past Surgical History:  Procedure Laterality Date  . CARDIOVASCULAR STRESS TEST  10/27/2011   Negative Bruce Protocol  . KIDNEY STONE SURGERY  1997  . US ECHOCARDIOGRAPHY  10/27/2011   trace TR, RV systolic function borderline reduced.     OB History   No obstetric history on file.     Family History  Problem Relation Age of Onset  . Cancer Mother        breast  . Cancer Father        bladder  . Hypertension Father     Social History   Tobacco Use  . Smoking status: Never Smoker  . Smokeless tobacco: Never Used  Substance Use Topics  . Alcohol use: Yes    Alcohol/week: 0.0 standard drinks    Comment: occas.  . Drug use: No    Home Medications Prior to Admission medications   Medication Sig Start Date End Date Taking? Authorizing Provider  acetaminophen (TYLENOL) 500 MG tablet Take 1,000 mg by mouth every 6 (six) hours as needed for moderate pain.    [provider]  ALPRAZolam Prudy Feeler) 0.25 MG tablet Take 0.25 mg by mouth at bedtime as needed for sleep.    [provider]  apixaban (ELIQUIS) 5 MG TABS tablet Take 1 tablet (5 mg total) by mouth 2 (two) times daily. 12/16/19   Croitoru, Mihai, MD  flecainide (TAMBOCOR) 50 MG tablet TAKE 1 TABLET(50 MG) BY MOUTH TWICE DAILY 11/12/19   Croitoru, Rachelle Hora, MD  metoprolol succinate (TOPROL-XL) 50 MG 24 hr tablet Take 1 tablet (50 mg total) by mouth 2 (two) times daily. Take with or immediately following a meal. 07/08/19   Croitoru, Mihai, MD  sertraline (ZOLOFT) 25 MG tablet Take 12.5 mg by mouth daily.     [provider]    Allergies    Atorvastatin  Review of Systems   Review of Systems  All other systems reviewed and are negative.   Physical Exam Updated Vital Signs There were no vitals taken for this visit.  Physical Exam Vitals and nursing note reviewed.  Constitutional:      General: She is not in acute distress.    Appearance: Normal appearance. She is obese.  HENT:     Head: Normocephalic.     Right Ear: External ear normal.     Left Ear: External ear normal.     Nose: Nose normal.  Mouth/Throat:     Pharynx: Oropharynx is clear.  Eyes:     Pupils: Pupils are equal, round, and reactive to light.  Cardiovascular:     Rate and Rhythm: Normal rate.     Pulses: Normal pulses.  Pulmonary:     Effort: Pulmonary effort is normal.     Breath sounds: Normal breath sounds.  Abdominal:     General: Abdomen is flat.  Musculoskeletal:     Cervical back: Normal range of motion.     Comments: Right ankle with obvious deformity Sensation intact Dorsal pedals pulses intact No proximal tenderness   Skin:    General: Skin is warm and dry.     Capillary Refill: Capillary refill takes less than 2 seconds.  Neurological:     General: No focal deficit present.     Mental Status: She is alert.  Psychiatric:        Mood and Affect: Mood normal.     ED Results /  Procedures / Treatments   Labs (all labs ordered are listed, but only abnormal results are displayed) Labs Reviewed  RESPIRATORY PANEL BY RT PCR (FLU A&B, COVID)  CBC  BASIC METABOLIC PANEL    EKG None  Radiology No results found.  Procedures .Sedation  Date/Time: 03/31/2020 1:16 PM Performed by: Margarita Grizzle, MD Authorized by: Margarita Grizzle, MD   Consent:    Consent obtained:  Verbal   Consent given by:  Patient   Risks discussed:  Allergic reaction, dysrhythmia, inadequate sedation, nausea, prolonged hypoxia resulting in organ damage, prolonged sedation necessitating reversal, respiratory compromise necessitating ventilatory assistance and intubation and vomiting   Alternatives discussed:  Analgesia without sedation, anxiolysis and regional anesthesia Universal protocol:    Procedure explained and questions answered to patient or proxy's satisfaction: yes     Relevant documents present and verified: yes     Test results available and properly labeled: yes     Imaging studies available: yes     Required blood products, implants, devices, and special equipment available: yes     Site/side marked: yes     Immediately prior to procedure a time out was called: yes     Patient identity confirmation method:  Verbally with patient Indications:    Procedure necessitating sedation performed by:  Physician performing sedation Pre-sedation assessment:    Time since last food or drink:  2   ASA classification: class 1 - normal, healthy patient     Neck mobility: normal     Mouth opening:  3 or more finger widths   Thyromental distance:  4 finger widths   Mallampati score:  I - soft palate, uvula, fauces, pillars visible   Pre-sedation assessments completed and reviewed: airway patency, cardiovascular function, hydration status, mental status, nausea/vomiting, pain level, respiratory function and temperature     Pre-sedation assessment completed:  03/31/2020 12:30 PM Immediate  pre-procedure details:    Reassessment: Patient reassessed immediately prior to procedure     Reviewed: vital signs, relevant labs/tests and NPO status     Verified: bag valve mask available, emergency equipment available, intubation equipment available, IV patency confirmed, oxygen available and suction available   Procedure details (see MAR for exact dosages):    Preoxygenation:  Nasal cannula   Sedation:  Propofol   Intended level of sedation: deep   Intra-procedure monitoring:  Blood pressure monitoring, cardiac monitor, continuous pulse oximetry, frequent LOC assessments, frequent vital sign checks and continuous capnometry   Intra-procedure events: none  Total Provider sedation time (minutes):  10 Post-procedure details:    Attendance: Constant attendance by certified staff until patient recovered     Recovery: Patient returned to pre-procedure baseline     Post-sedation assessments completed and reviewed: airway patency, cardiovascular function, hydration status, mental status, nausea/vomiting, pain level, respiratory function and temperature     Patient is stable for discharge or admission: yes     Patient tolerance:  Tolerated well, no immediate complications .Ortho Injury Treatment  Date/Time: 03/31/2020 1:17 PM Performed by: Margarita Grizzle, MD Authorized by: Margarita Grizzle, MD   Consent:    Consent obtained:  Written   Consent given by:  Patient   Risks discussed:  Irreducible dislocation   Alternatives discussed:  No treatmentInjury location: ankle Injury type: fracture-dislocation Pre-procedure neurovascular assessment: neurovascularly intact Pre-procedure distal perfusion: normal Pre-procedure neurological function: normal Pre-procedure range of motion: reduced  Anesthesia: Local anesthesia used: no Manipulation performed: yes Reduction successful: yes X-Latesia Norrington confirmed reduction: yes Immobilization: splint Splint type: short leg Supplies used: cotton padding,   elastic bandage and Ortho-Glass Post-procedure neurovascular assessment: post-procedure neurovascularly intact Post-procedure distal perfusion: normal Post-procedure neurological function: normal Post-procedure range of motion: unchanged Patient tolerance: patient tolerated the procedure well with no immediate complications    (including critical care time)  Medications Ordered in ED Medications  propofol (DIPRIVAN) 10 mg/mL bolus/IV push 0.5 mg/kg (has no administration in time range)    ED Course  I have reviewed the triage vital signs and the nursing notes.  Pertinent labs & imaging results that were available during my care of the patient were reviewed by me and considered in my medical decision making (see chart for details).    MDM Rules/Calculators/A&P                          Fracture dislocation right ankle reduced here in ED Chronic anticoagulation with eliquis for a fib- patient in nsr here Patient has had covid vaccine and is covid negative here 2:22 PM Patient is hemodynamically stable Discussed patient's care with Dr. Ave Filter Plan recheck with Ortho on Friday for surgical planning. I have discussed return precautions Patient is on Xanax 3 times daily.  She is feeling comfortable here without any pain medication since fracture reduction and immobilization. I will prescribe Percocet, but she is cautioned to use only half a pill if needed.  She will attempt to manage with Tylenol only. She is given crutches here and is instructed regarding need for follow-up. Final Clinical Impression(s) / ED Diagnoses Final diagnoses:  Closed fracture of right ankle, initial encounter    Rx / DC Orders ED Discharge Orders    None       Margarita Grizzle, MD 03/31/20 1429

## 2020-03-31 NOTE — Discharge Instructions (Signed)
Do not put weight on right ankle Use crutches as needed Keep ankle elevated with cold therapy Use Tylenol for pain You are given a prescription for Percocet, if you need this, please start with only half a pill

## 2020-04-02 ENCOUNTER — Encounter (HOSPITAL_BASED_OUTPATIENT_CLINIC_OR_DEPARTMENT_OTHER): Payer: Self-pay | Admitting: Orthopaedic Surgery

## 2020-04-02 ENCOUNTER — Other Ambulatory Visit: Payer: Self-pay | Admitting: Orthopaedic Surgery

## 2020-04-02 ENCOUNTER — Telehealth: Payer: Self-pay

## 2020-04-02 ENCOUNTER — Other Ambulatory Visit: Payer: Self-pay

## 2020-04-02 DIAGNOSIS — S82841A Displaced bimalleolar fracture of right lower leg, initial encounter for closed fracture: Secondary | ICD-10-CM | POA: Diagnosis not present

## 2020-04-02 NOTE — Telephone Encounter (Signed)
Scheduled pt for 04/06/20 at 9:45am w/ Jacolyn Reedy.

## 2020-04-02 NOTE — Telephone Encounter (Signed)
Will send to [facility] scheduling to try and make appt for for pre op clearance. Pt surgery is scheduled for 04/06/20.

## 2020-04-02 NOTE — Telephone Encounter (Signed)
Primary Cardiologist:Mihai Croitoru, MD  Chart reviewed as part of pre-operative protocol coverage. Because of Brandi Waters past medical history and time since last visit, he/she will require a follow-up visit in order to better assess preoperative cardiovascular risk.  Pre-op covering staff: - Please schedule appointment and call patient to inform them. - Please contact requesting surgeon's office via preferred method (i.e, phone, fax) to inform them of need for appointment prior to surgery.  If applicable, this message will also be routed to pharmacy pool and/or primary cardiologist for input on holding anticoagulant/antiplatelet agent as requested below so that this information is available at time of patient's appointment.   Ronney Asters, NP  04/02/2020, 1:15 PM

## 2020-04-02 NOTE — Telephone Encounter (Signed)
   [facility] Pre-operative Risk Assessment    Says URGENT ON CLEARANCE REQUEST  Request for surgical clearance:  1. What type of surgery is being performed? OPEN REDUCTION INTERNAL FIXATION RIGHT ANKLE, POSSIBLE SYNDESMOSIS   2. When is this surgery scheduled? 04-06-2020  What type of clearance is required (medical clearance vs. Pharmacy clearance to hold med vs. Both)?  MEDICAL 3. Are there any medications that need to be held prior to surgery and how long? NONE   4. Practice name and name of physician performing surgery? [facility]  ATTN:TATIANA 5.   6. What is the office phone number? [phone]   7.   What is the office fax number? [phone]  8.   Anesthesia type (None, local, MAC, general) ? GENERAL

## 2020-04-02 NOTE — Telephone Encounter (Signed)
The earliest appt we have is 04/07/20 at [facility] with Norma Fredrickson.

## 2020-04-02 NOTE — Telephone Encounter (Signed)
Pt has been scheduled to see Jacolyn Reedy, [facility] 04/06/20 for pre op assessment. Pt's surgery is scheduled for 04/06/20. I will send FYI to requesting office as pt's surgery will need to be postponed until she has been cleared by cardiology. Will remove from the pre op call back pool.

## 2020-04-05 ENCOUNTER — Other Ambulatory Visit (HOSPITAL_COMMUNITY): Payer: BC Managed Care – PPO

## 2020-04-05 NOTE — Progress Notes (Signed)
Cardiology Office Note    Date:  04/06/2020   ID:  Briggette, Najarian 08-25-1955, MRN [id number]  PCP:  Blair Heys, MD  Cardiologist: Thurmon Fair, MD EPS: None  No chief complaint on file.   History of Present Illness:  Brandi Waters is a 64 y.o. female with history of PAF on eliquis and flecainide, PVCs, palpitations, hypertension, hyperlipidemia  Patient placed on my schedule for preoperative clearance for open reduction and internal fixation of right ankle by Dr. Dub Mikes.  Last signed by Dr. Royann Shivers 07/2019 at which time she was doing well.  Recommended periodic evaluation of CAD on flecainide with borderline QRS dating back to 2014.  Denies chest pain,dyspnea, palpitations, dizziness or presyncope. Prior to ankle injury no regular exercise.  Past Medical History:  Diagnosis Date  . Anxiety   . Closed right trimalleolar fracture   . Depression   . GERD (gastroesophageal reflux disease)   . Hyperlipidemia   . Obesity   . PAF (paroxysmal atrial fibrillation) (HCC)   . Systemic hypertension     Past Surgical History:  Procedure Laterality Date  . ABDOMINAL HYSTERECTOMY    . CARDIOVASCULAR STRESS TEST  10/27/2011   Negative Bruce Protocol  . EYE SURGERY Left    cataract  . KIDNEY STONE SURGERY  1997  . US ECHOCARDIOGRAPHY  10/27/2011   trace TR, RV systolic function borderline reduced.    Current Medications: Current Meds  Medication Sig  . acetaminophen (TYLENOL) 500 MG tablet Take 1,000 mg by mouth every 6 (six) hours as needed for moderate pain.  Marland Kitchen ALPRAZolam (XANAX) 0.5 MG tablet Take 0.5-1 mg by mouth 2 (two) times daily as needed for anxiety.  Marland Kitchen apixaban (ELIQUIS) 5 MG TABS tablet Take 1 tablet (5 mg total) by mouth 2 (two) times daily.  . flecainide (TAMBOCOR) 50 MG tablet TAKE 1 TABLET(50 MG) BY MOUTH TWICE DAILY  . metoprolol succinate (TOPROL-XL) 50 MG 24 hr tablet Take 1.5 tablets (75 mg total) by mouth 2 (two) times daily. Take with  or immediately following a meal.  . oxyCODONE-acetaminophen (PERCOCET/ROXICET) 5-325 MG tablet Take 1 tablet by mouth every 6 (six) hours as needed for severe pain (pain).  Marland Kitchen sertraline (ZOLOFT) 100 MG tablet Take 100 mg by mouth daily.  . [DISCONTINUED] metoprolol succinate (TOPROL-XL) 50 MG 24 hr tablet Take 1 tablet (50 mg total) by mouth 2 (two) times daily. Take with or immediately following a meal.     Allergies:   Atorvastatin   Social History   Socioeconomic History  . Marital status: Married    Spouse name: Not on file  . Number of children: Not on file  . Years of education: Not on file  . Highest education level: Not on file  Occupational History  . Not on file  Tobacco Use  . Smoking status: Never Smoker  . Smokeless tobacco: Never Used  Substance and Sexual Activity  . Alcohol use: Not Currently    Alcohol/week: 0.0 standard drinks  . Drug use: No  . Sexual activity: Not on file  Other Topics Concern  . Not on file  Social History Narrative  . Not on file   Social Determinants of Health   Financial Resource Strain:   . Difficulty of Paying Living Expenses: Not on file  Food Insecurity:   . Worried About Programme researcher, broadcasting/film/video in the Last Year: Not on file  . Ran Out of Food in the Last Year: Not on  file  Transportation Needs:   . Freight forwarder (Medical): Not on file  . Lack of Transportation (Non-Medical): Not on file  Physical Activity:   . Days of Exercise per Week: Not on file  . Minutes of Exercise per Session: Not on file  Stress:   . Feeling of Stress : Not on file  Social Connections:   . Frequency of Communication with Friends and Family: Not on file  . Frequency of Social Gatherings with Friends and Family: Not on file  . Attends Religious Services: Not on file  . Active Member of Clubs or Organizations: Not on file  . Attends Banker Meetings: Not on file  . Marital Status: Not on file     Family History:  The patient's  family history includes Cancer in her father and mother; Hypertension in her father.   ROS:   Please see the history of present illness.    ROS All other systems reviewed and are negative.   PHYSICAL EXAM:   VS:  BP 140/78   Pulse 75   Ht 5\' 6"  (1.676 m)   SpO2 98%   BMI 33.91 kg/m   Physical Exam  , in no acute distress  Neck: no JVD, carotid bruits, or masses Cardiac:RRR; no murmurs, rubs, or gallops  Respiratory:  clear to auscultation bilaterally, normal work of breathing GI: soft, nontender, nondistended, + BS Ext: without cyanosis, clubbing, or edema, Good distal pulses bilaterally Neuro:  Alert and Oriented x 3 Psych: euthymic mood, full affect  Wt Readings from Last 3 Encounters:  03/31/20 210 lb (95.3 kg)  07/08/19 202 lb (91.6 kg)  01/20/19 220 lb (99.8 kg)      Studies/Labs Reviewed:   EKG:  EKG is not ordered today.  The ekg reviewed from the ER 03/31/2020 normal sinus rhythm with QTC of 479 Recent Labs: 03/31/2020: BUN 16; Creatinine, Ser 0.60; Hemoglobin 12.6; Platelets 151; Potassium 4.1; Sodium 136   Lipid Panel No results found for: CHOL, TRIG, HDL, CHOLHDL, VLDL, LDLCALC, LDLDIRECT  Additional studies/ records that were reviewed today include:       ASSESSMENT:    1. Preoperative clearance   2. Paroxysmal atrial fibrillation (HCC)   3. PVC (premature ventricular contraction)   4. Essential hypertension   5. Palpitations      PLAN:  In order of problems listed above:  Preoperative clearance for open reduction internal fixation of right ankle by Dr. 04/02/2020 04/13/2020 According to the Revised Cardiac Risk Index (RCRI), her Perioperative Risk of Major Cardiac Event is (%): 0.4  Her Functional Capacity in METs is: 6.7 according to the Duke Activity Status Index (DASI).  Patient is asymptomatic prior to her ankle injury her METs is 6.7 and perioperative risk is low at 0.4%.  Discussed with Dr. 06/13/2020 who concurs that she  can proceed with surgery without further cardiac testing.Pharmacy ok'ed holding Eliquis for 2 days. thanks  PAF controlled with flecainide with borderline QRS seen as far back as 2014.  Will need periodic reassessment for evidence of CAD.  Consider treadmill test next year per Dr. Royann Shivers, no need to be done prior to surgery.  PVCs controlled with metoprolol  Hypertension blood pressure controlled with metoprolol   Medication Adjustments/Labs and Tests Ordered: Current medicines are reviewed at length with the patient today.  Concerns regarding medicines are outlined above.  Medication changes, Labs and Tests ordered today are listed in the Patient Instructions below. Patient Instructions   Medication Instructions:  Your physician recommends that you continue on your current medications as directed. Please refer to the Current Medication list given to you today.  *If you need a refill on your cardiac medications before your next appointment, please call your pharmacy*   Lab Work: None If you have labs (blood work) drawn today and your tests are completely normal, you will receive your results only by: Marland Kitchen MyChart Message (if you have MyChart) OR . A paper copy in the mail If you have any lab test that is abnormal or we need to change your treatment, we will call you to review the results.   Testing/Procedures: None   Follow-Up: At [facility], you and your health needs are our priority.  As part of our continuing mission to provide you with exceptional heart care, we have created designated Provider Care Teams.  These Care Teams include your primary Cardiologist (physician) and Advanced Practice Providers (APPs -  Physician Assistants and Nurse Practitioners) who all work together to provide you with the care you need, when you need it.       Signed, Jacolyn Reedy, PA-C  04/06/2020 10:10 AM    [facility] [address] [place], [place], [place]   [address] Phone: [phone]; Fax: [phone]

## 2020-04-06 ENCOUNTER — Other Ambulatory Visit: Payer: Self-pay

## 2020-04-06 ENCOUNTER — Ambulatory Visit (INDEPENDENT_AMBULATORY_CARE_PROVIDER_SITE_OTHER): Payer: BC Managed Care – PPO | Admitting: Physician Assistant

## 2020-04-06 ENCOUNTER — Encounter (HOSPITAL_BASED_OUTPATIENT_CLINIC_OR_DEPARTMENT_OTHER): Payer: Self-pay | Admitting: Orthopaedic Surgery

## 2020-04-06 ENCOUNTER — Encounter: Payer: Self-pay | Admitting: Physician Assistant

## 2020-04-06 VITALS — BP 140/78 | HR 75 | Ht 66.0 in

## 2020-04-06 DIAGNOSIS — R002 Palpitations: Secondary | ICD-10-CM

## 2020-04-06 DIAGNOSIS — I480 Paroxysmal atrial fibrillation: Secondary | ICD-10-CM | POA: Diagnosis not present

## 2020-04-06 DIAGNOSIS — Z01818 Encounter for other preprocedural examination: Secondary | ICD-10-CM | POA: Diagnosis not present

## 2020-04-06 DIAGNOSIS — I493 Ventricular premature depolarization: Secondary | ICD-10-CM

## 2020-04-06 DIAGNOSIS — I10 Essential (primary) hypertension: Secondary | ICD-10-CM

## 2020-04-06 MED ORDER — METOPROLOL SUCCINATE ER 50 MG PO TB24
75.0000 mg | ORAL_TABLET | Freq: Two times a day (BID) | ORAL | 3 refills | Status: DC
Start: 2020-04-06 — End: 2020-04-06

## 2020-04-06 MED ORDER — METOPROLOL SUCCINATE ER 50 MG PO TB24: 50.0000 mg | ORAL_TABLET | Freq: Two times a day (BID) | ORAL | 3 refills | Status: DC

## 2020-04-06 NOTE — Patient Instructions (Addendum)
Medication Instructions:  Your physician recommends that you continue on your current medications as directed. Please refer to the Current Medication list given to you today.  *If you need a refill on your cardiac medications before your next appointment, please call your pharmacy*   Lab Work: None If you have labs (blood work) drawn today and your tests are completely normal, you will receive your results only by: . MyChart Message (if you have MyChart) OR . A paper copy in the mail If you have any lab test that is abnormal or we need to change your treatment, we will call you to review the results.   Testing/Procedures: None   Follow-Up: At [facility], you and your health needs are our priority.  As part of our continuing mission to provide you with exceptional heart care, we have created designated Provider Care Teams.  These Care Teams include your primary Cardiologist (physician) and Advanced Practice Providers (APPs -  Physician Assistants and Nurse Practitioners) who all work together to provide you with the care you need, when you need it.    

## 2020-04-06 NOTE — Addendum Note (Signed)
Addended by: Cleda Mccreedy on: 04/06/2020 12:19 PM   Modules accepted: Orders

## 2020-04-09 ENCOUNTER — Other Ambulatory Visit (HOSPITAL_COMMUNITY)
Admission: RE | Admit: 2020-04-09 | Discharge: 2020-04-09 | Disposition: A | Discharge status: Home / Self Care | Payer: BC Managed Care – PPO | Source: Ambulatory Visit | Attending: Orthopaedic Surgery | Admitting: Orthopaedic Surgery

## 2020-04-09 DIAGNOSIS — Z20822 Contact with and (suspected) exposure to covid-19: Secondary | ICD-10-CM | POA: Diagnosis not present

## 2020-04-09 DIAGNOSIS — Z01812 Encounter for preprocedural laboratory examination: Secondary | ICD-10-CM | POA: Diagnosis not present

## 2020-04-09 LAB — SARS CORONAVIRUS 2 (TAT 6-24 HRS): SARS Coronavirus 2: NEGATIVE

## 2020-04-13 ENCOUNTER — Ambulatory Visit (HOSPITAL_BASED_OUTPATIENT_CLINIC_OR_DEPARTMENT_OTHER)
Admission: RE | Admit: 2020-04-13 | Discharge: 2020-04-13 | Disposition: A | Discharge status: Home / Self Care | Payer: BC Managed Care – PPO | Attending: Orthopaedic Surgery | Admitting: Orthopaedic Surgery

## 2020-04-13 ENCOUNTER — Ambulatory Visit (HOSPITAL_COMMUNITY): Payer: BC Managed Care – PPO

## 2020-04-13 ENCOUNTER — Encounter (HOSPITAL_BASED_OUTPATIENT_CLINIC_OR_DEPARTMENT_OTHER): Payer: Self-pay | Admitting: Orthopaedic Surgery

## 2020-04-13 ENCOUNTER — Ambulatory Visit (HOSPITAL_BASED_OUTPATIENT_CLINIC_OR_DEPARTMENT_OTHER): Payer: BC Managed Care – PPO | Admitting: Anesthesiology

## 2020-04-13 ENCOUNTER — Encounter (HOSPITAL_BASED_OUTPATIENT_CLINIC_OR_DEPARTMENT_OTHER)
Admission: RE | Disposition: A | Discharge status: Home / Self Care | Payer: Self-pay | Source: Home / Self Care | Attending: Orthopaedic Surgery

## 2020-04-13 ENCOUNTER — Other Ambulatory Visit: Payer: Self-pay

## 2020-04-13 DIAGNOSIS — I10 Essential (primary) hypertension: Secondary | ICD-10-CM | POA: Insufficient documentation

## 2020-04-13 DIAGNOSIS — F329 Major depressive disorder, single episode, unspecified: Secondary | ICD-10-CM | POA: Insufficient documentation

## 2020-04-13 DIAGNOSIS — Z79899 Other long term (current) drug therapy: Secondary | ICD-10-CM | POA: Insufficient documentation

## 2020-04-13 DIAGNOSIS — Z888 Allergy status to other drugs, medicaments and biological substances status: Secondary | ICD-10-CM | POA: Diagnosis not present

## 2020-04-13 DIAGNOSIS — F419 Anxiety disorder, unspecified: Secondary | ICD-10-CM | POA: Diagnosis not present

## 2020-04-13 DIAGNOSIS — Z4789 Encounter for other orthopedic aftercare: Secondary | ICD-10-CM

## 2020-04-13 DIAGNOSIS — E669 Obesity, unspecified: Secondary | ICD-10-CM | POA: Diagnosis not present

## 2020-04-13 DIAGNOSIS — S82851A Displaced trimalleolar fracture of right lower leg, initial encounter for closed fracture: Secondary | ICD-10-CM | POA: Diagnosis not present

## 2020-04-13 DIAGNOSIS — X58XXXA Exposure to other specified factors, initial encounter: Secondary | ICD-10-CM | POA: Insufficient documentation

## 2020-04-13 DIAGNOSIS — I480 Paroxysmal atrial fibrillation: Secondary | ICD-10-CM | POA: Diagnosis not present

## 2020-04-13 DIAGNOSIS — S8251XD Displaced fracture of medial malleolus of right tibia, subsequent encounter for closed fracture with routine healing: Secondary | ICD-10-CM | POA: Diagnosis not present

## 2020-04-13 DIAGNOSIS — G8918 Other acute postprocedural pain: Secondary | ICD-10-CM | POA: Diagnosis not present

## 2020-04-13 DIAGNOSIS — Z7901 Long term (current) use of anticoagulants: Secondary | ICD-10-CM | POA: Diagnosis not present

## 2020-04-13 HISTORY — DX: Gastro-esophageal reflux disease without esophagitis: K21.9

## 2020-04-13 HISTORY — DX: Displaced trimalleolar fracture of right lower leg, initial encounter for closed fracture: S82.851A

## 2020-04-13 HISTORY — PX: SYNDESMOSIS REPAIR: SHX5182

## 2020-04-13 HISTORY — DX: Depression, unspecified: F32.A

## 2020-04-13 HISTORY — PX: ORIF ANKLE FRACTURE: SHX5408

## 2020-04-13 HISTORY — DX: Anxiety disorder, unspecified: F41.9

## 2020-04-13 SURGERY — OPEN REDUCTION INTERNAL FIXATION (ORIF) ANKLE FRACTURE
Anesthesia: General | Site: Ankle | Laterality: Right

## 2020-04-13 MED ORDER — DEXAMETHASONE SODIUM PHOSPHATE 10 MG/ML IJ SOLN
INTRAMUSCULAR | Status: DC | PRN
  Administered 2020-04-13: 10 mg via INTRAVENOUS

## 2020-04-13 MED ORDER — FENTANYL CITRATE (PF) 100 MCG/2ML IJ SOLN
100.0000 ug | Freq: Once | INTRAMUSCULAR | Status: AC
  Administered 2020-04-13: 50 ug via INTRAVENOUS

## 2020-04-13 MED ORDER — PROPOFOL 10 MG/ML IV BOLUS
INTRAVENOUS | Status: DC | PRN
  Administered 2020-04-13: 170 mg via INTRAVENOUS

## 2020-04-13 MED ORDER — FENTANYL CITRATE (PF) 100 MCG/2ML IJ SOLN
INTRAMUSCULAR | Status: AC
  Filled 2020-04-13: qty 2

## 2020-04-13 MED ORDER — OXYCODONE HCL 5 MG/5ML PO SOLN: 5.0000 mg | Freq: Once | ORAL | Status: DC | PRN

## 2020-04-13 MED ORDER — LACTATED RINGERS IV SOLN: INTRAVENOUS | Status: DC

## 2020-04-13 MED ORDER — PHENYLEPHRINE HCL (PRESSORS) 10 MG/ML IV SOLN
INTRAVENOUS | Status: DC | PRN
  Administered 2020-04-13: 120 ug via INTRAVENOUS

## 2020-04-13 MED ORDER — OXYCODONE HCL 5 MG PO TABS: 5.0000 mg | ORAL_TABLET | ORAL | 0 refills | Status: AC | PRN

## 2020-04-13 MED ORDER — MIDAZOLAM HCL 2 MG/2ML IJ SOLN
2.0000 mg | Freq: Once | INTRAMUSCULAR | Status: AC
  Administered 2020-04-13: 1 mg via INTRAVENOUS

## 2020-04-13 MED ORDER — FENTANYL CITRATE (PF) 100 MCG/2ML IJ SOLN: 25.0000 ug | INTRAMUSCULAR | Status: DC | PRN

## 2020-04-13 MED ORDER — FENTANYL CITRATE (PF) 250 MCG/5ML IJ SOLN
INTRAMUSCULAR | Status: DC | PRN
  Administered 2020-04-13 (×2): 25 ug via INTRAVENOUS

## 2020-04-13 MED ORDER — MIDAZOLAM HCL 2 MG/2ML IJ SOLN
INTRAMUSCULAR | Status: AC
  Filled 2020-04-13: qty 2

## 2020-04-13 MED ORDER — OXYCODONE HCL 5 MG PO TABS: 5.0000 mg | ORAL_TABLET | Freq: Once | ORAL | Status: DC | PRN

## 2020-04-13 MED ORDER — ROCURONIUM BROMIDE 10 MG/ML (PF) SYRINGE
PREFILLED_SYRINGE | INTRAVENOUS | Status: AC
  Filled 2020-04-13: qty 10

## 2020-04-13 MED ORDER — ONDANSETRON HCL 4 MG/2ML IJ SOLN
INTRAMUSCULAR | Status: AC
  Filled 2020-04-13: qty 2

## 2020-04-13 MED ORDER — ONDANSETRON HCL 4 MG/2ML IJ SOLN: 4.0000 mg | Freq: Once | INTRAMUSCULAR | Status: DC | PRN

## 2020-04-13 MED ORDER — LIDOCAINE HCL (CARDIAC) PF 100 MG/5ML IV SOSY
PREFILLED_SYRINGE | INTRAVENOUS | Status: DC | PRN
  Administered 2020-04-13: 50 mg via INTRATRACHEAL

## 2020-04-13 MED ORDER — PROPOFOL 10 MG/ML IV BOLUS
INTRAVENOUS | Status: AC
  Filled 2020-04-13: qty 40

## 2020-04-13 MED ORDER — DEXAMETHASONE SODIUM PHOSPHATE 10 MG/ML IJ SOLN
INTRAMUSCULAR | Status: AC
  Filled 2020-04-13: qty 1

## 2020-04-13 MED ORDER — ONDANSETRON HCL 4 MG/2ML IJ SOLN
INTRAMUSCULAR | Status: DC | PRN
  Administered 2020-04-13: 4 mg via INTRAVENOUS

## 2020-04-13 MED ORDER — CEFAZOLIN SODIUM-DEXTROSE 2-4 GM/100ML-% IV SOLN
2.0000 g | INTRAVENOUS | Status: AC
  Administered 2020-04-13: 2 g via INTRAVENOUS

## 2020-04-13 MED ORDER — LIDOCAINE 2% (20 MG/ML) 5 ML SYRINGE
INTRAMUSCULAR | Status: AC
  Filled 2020-04-13: qty 5

## 2020-04-13 MED ORDER — CEFAZOLIN SODIUM-DEXTROSE 2-4 GM/100ML-% IV SOLN
INTRAVENOUS | Status: AC
  Filled 2020-04-13: qty 100

## 2020-04-13 SURGICAL SUPPLY — 74 items
APL PRP STRL LF DISP 70% ISPRP (MISCELLANEOUS) ×1
APL SKNCLS STERI-STRIP NONHPOA (GAUZE/BANDAGES/DRESSINGS)
BANDAGE ESMARK 6X9 LF (GAUZE/BANDAGES/DRESSINGS) ×1 IMPLANT
BENZOIN TINCTURE PRP APPL 2/3 (GAUZE/BANDAGES/DRESSINGS) IMPLANT
BIT DRILL 2 CANN GRADUATED (BIT) ×1 IMPLANT
BIT DRILL 2.5 CANN LNG (BIT) ×1 IMPLANT
BIT DRILL 2.6 CANN (BIT) ×1 IMPLANT
BLADE SURG 15 STRL LF DISP TIS (BLADE) ×2 IMPLANT
BLADE SURG 15 STRL SS (BLADE) ×4
BNDG CMPR 9X6 STRL LF SNTH (GAUZE/BANDAGES/DRESSINGS) ×1
BNDG COHESIVE 4X5 TAN STRL (GAUZE/BANDAGES/DRESSINGS) IMPLANT
BNDG ELASTIC 6X5.8 VLCR STR LF (GAUZE/BANDAGES/DRESSINGS) ×4 IMPLANT
BNDG ESMARK 6X9 LF (GAUZE/BANDAGES/DRESSINGS) ×2
CHLORAPREP W/TINT 26 (MISCELLANEOUS) ×2 IMPLANT
COVER BACK TABLE 60X90IN (DRAPES) ×2 IMPLANT
COVER WAND RF STERILE (DRAPES) IMPLANT
CUFF TOURN SGL QUICK 34 (TOURNIQUET CUFF) ×2
CUFF TRNQT CYL 34X4.125X (TOURNIQUET CUFF) ×1 IMPLANT
DECANTER SPIKE VIAL GLASS SM (MISCELLANEOUS) IMPLANT
DRAPE C-ARM 42X72 X-RAY (DRAPES) IMPLANT
DRAPE C-ARMOR (DRAPES) IMPLANT
DRAPE EXTREMITY T 121X128X90 (DISPOSABLE) ×2 IMPLANT
DRAPE IMP U-DRAPE 54X76 (DRAPES) ×2 IMPLANT
DRAPE U-SHAPE 47X51 STRL (DRAPES) ×2 IMPLANT
ELECT REM PT RETURN 9FT ADLT (ELECTROSURGICAL) ×2
ELECTRODE REM PT RTRN 9FT ADLT (ELECTROSURGICAL) ×1 IMPLANT
GAUZE SPONGE 4X4 12PLY STRL (GAUZE/BANDAGES/DRESSINGS) ×2 IMPLANT
GAUZE XEROFORM 1X8 LF (GAUZE/BANDAGES/DRESSINGS) ×2 IMPLANT
GLOVE BIOGEL M STRL SZ7.5 (GLOVE) ×2 IMPLANT
GLOVE BIOGEL PI IND STRL 8 (GLOVE) ×1 IMPLANT
GLOVE BIOGEL PI INDICATOR 8 (GLOVE) ×1
GOWN STRL REUS W/ TWL LRG LVL3 (GOWN DISPOSABLE) ×1 IMPLANT
GOWN STRL REUS W/ TWL XL LVL3 (GOWN DISPOSABLE) ×1 IMPLANT
GOWN STRL REUS W/TWL LRG LVL3 (GOWN DISPOSABLE) ×2
GOWN STRL REUS W/TWL XL LVL3 (GOWN DISPOSABLE) ×2
GUIDEWIRE 1.35MM (WIRE) ×2 IMPLANT
NS IRRIG 1000ML POUR BTL (IV SOLUTION) ×2 IMPLANT
PACK BASIN DAY SURGERY FS (CUSTOM PROCEDURE TRAY) ×2 IMPLANT
PAD CAST 4YDX4 CTTN HI CHSV (CAST SUPPLIES) ×1 IMPLANT
PADDING CAST COTTON 4X4 STRL (CAST SUPPLIES) ×2
PADDING CAST SYNTHETIC 4 (CAST SUPPLIES) ×2
PADDING CAST SYNTHETIC 4X4 STR (CAST SUPPLIES) ×2 IMPLANT
PENCIL SMOKE EVACUATOR (MISCELLANEOUS) ×2 IMPLANT
PLATE LOCK DIST FIB RT 5H (Plate) ×1 IMPLANT
SCREW CORTICAL LP 3.0X14MM (Screw) ×1 IMPLANT
SCREW LO-PRO TI 3.5X16MM (Screw) ×1 IMPLANT
SCREW LOCK TI QF 3X12 (Screw) ×1 IMPLANT
SCREW LOCK TI QF 3X14 (Screw) ×1 IMPLANT
SCREW LOCKING VARIABLE 3.0X16 (Screw) ×1 IMPLANT
SCREW LP 3.5X12MM (Screw) ×1 IMPLANT
SCREW LP CORT 3.0X24 (Screw) ×1 IMPLANT
SCREW LP TI 3.5X14MM (Screw) ×2 IMPLANT
SCREW LP TITANIUM 3.5X40 (Screw) ×2 IMPLANT
SHEET MEDIUM DRAPE 40X70 STRL (DRAPES) ×2 IMPLANT
SLEEVE SCD COMPRESS KNEE MED (MISCELLANEOUS) ×2 IMPLANT
SPLINT FAST PLASTER 5X30 (CAST SUPPLIES) ×20
SPLINT PLASTER CAST FAST 5X30 (CAST SUPPLIES) ×20 IMPLANT
SPONGE LAP 18X18 RF (DISPOSABLE) IMPLANT
STAPLER VISISTAT 35W (STAPLE) IMPLANT
STOCKINETTE 6  STRL (DRAPES) ×2
STOCKINETTE 6 STRL (DRAPES) ×1 IMPLANT
STRIP CLOSURE SKIN 1/2X4 (GAUZE/BANDAGES/DRESSINGS) IMPLANT
SUCTION FRAZIER HANDLE 10FR (MISCELLANEOUS) ×2
SUCTION TUBE FRAZIER 10FR DISP (MISCELLANEOUS) ×1 IMPLANT
SUT ETHILON 3 0 PS 1 (SUTURE) ×2 IMPLANT
SUT MNCRL AB 3-0 PS2 18 (SUTURE) ×2 IMPLANT
SUT PDS AB 2-0 CT2 27 (SUTURE) ×2 IMPLANT
SUT VIC AB 2-0 SH 27 (SUTURE)
SUT VIC AB 2-0 SH 27XBRD (SUTURE) IMPLANT
SUT VIC AB 3-0 FS2 27 (SUTURE) IMPLANT
SYR BULB EAR ULCER 3OZ GRN STR (SYRINGE) ×2 IMPLANT
TOWEL GREEN STERILE FF (TOWEL DISPOSABLE) ×4 IMPLANT
TUBE CONNECTING 20X1/4 (TUBING) ×2 IMPLANT
UNDERPAD 30X36 HEAVY ABSORB (UNDERPADS AND DIAPERS) ×2 IMPLANT

## 2020-04-13 NOTE — Discharge Instructions (Signed)
DR. [facility] POST-OP INSTRUCTIONS   Pain Management 1. The numbing medicine and your leg will last around 18 hours, take a dose of your pain medicine as soon as you feel it wearing off to avoid rebound pain. 2. Keep your foot elevated above heart level.  Make sure that your heel hangs free ('floats'). 3. Take all prescribed medication as directed. 4. If taking narcotic pain medication you may want to use an over-the-counter stool softener to avoid constipation. 5. You may take over-the-counter NSAIDs (ibuprofen, naproxen, etc.) as well as over-the-counter acetaminophen as directed on the packaging as a supplement for your pain and may also use it to wean away from the prescription medication.  Activity ? Non-weightbearing ? Keep splint intact  First Postoperative Visit 1. Your first postop visit will be at least 2 weeks after surgery.  This should be scheduled when you schedule surgery. 2. If you do not have a postoperative visit scheduled please call [phone] to schedule an appointment. 3. At the appointment your incision will be evaluated for suture removal, x-rays will be obtained if necessary.  General Instructions 1. Swelling is very common after foot and ankle surgery.  It often takes 3 months for the foot and ankle to begin to feel comfortable.  Some amount of swelling will persist for 6-12 months. 2. DO NOT change the dressing.  If there is a problem with the dressing (too tight, loose, gets wet, etc.) please contact Dr. Adair's office. 3. DO NOT get the dressing wet.  For showers you can use an over-the-counter cast cover or wrap a washcloth around the top of your dressing and then cover it with a plastic bag and tape it to your leg. 4. DO NOT soak the incision (no tubs, pools, bath, etc.) until you have approval from Dr. Adair.  Contact Dr. Adairs office or go to Emergency Room if: 1. Temperature above 101 F. 2. Increasing pain that is unresponsive to pain  medication or elevation 3. Excessive redness or swelling in your foot 4. Dressing problems - excessive bloody drainage, looseness or tightness, or if dressing gets wet 5. Develop pain, swelling, warmth, or discoloration of your calf   Post Anesthesia Home Care Instructions  Activity: Get plenty of rest for the remainder of the day. A responsible individual must stay with you for 24 hours following the procedure.  For the next 24 hours, DO NOT: -Drive a car -Operate machinery -Drink alcoholic beverages -Take any medication unless instructed by your physician -Make any legal decisions or sign important papers.  Meals: Start with liquid foods such as gelatin or soup. Progress to regular foods as tolerated. Avoid greasy, spicy, heavy foods. If nausea and/or vomiting occur, drink only clear liquids until the nausea and/or vomiting subsides. Call your physician if vomiting continues.  Special Instructions/Symptoms: Your throat may feel dry or sore from the anesthesia or the breathing tube placed in your throat during surgery. If this causes discomfort, gargle with warm salt water. The discomfort should disappear within 24 hours.  If you had a scopolamine patch placed behind your ear for the management of post- operative nausea and/or vomiting:  1. The medication in the patch is effective for 72 hours, after which it should be removed.  Wrap patch in a tissue and discard in the trash. Wash hands thoroughly with soap and water. 2. You may remove the patch earlier than 72 hours if you experience unpleasant side effects which may include dry mouth, dizziness   or visual disturbances. 3. Avoid touching the patch. Wash your hands with soap and water after contact with the patch.     Regional Anesthesia Blocks  1. Numbness or the inability to move the "blocked" extremity may last from 3-48 hours after placement. The length of time depends on the medication injected and your individual response to  the medication. If the numbness is not going away after 48 hours, call your surgeon.  2. The extremity that is blocked will need to be protected until the numbness is gone and the  Strength has returned. Because you cannot feel it, you will need to take extra care to avoid injury. Because it may be weak, you may have difficulty moving it or using it. You may not know what position it is in without looking at it while the block is in effect.  3. For blocks in the legs and feet, returning to weight bearing and walking needs to be done carefully. You will need to wait until the numbness is entirely gone and the strength has returned. You should be able to move your leg and foot normally before you try and bear weight or walk. You will need someone to be with you when you first try to ensure you do not fall and possibly risk injury.  4. Bruising and tenderness at the needle site are common side effects and will resolve in a few days.  5. Persistent numbness or new problems with movement should be communicated to the surgeon or the [facility] ([phone])/ [facility] ([phone]).  

## 2020-04-13 NOTE — Transfer of Care (Signed)
Immediate Anesthesia Transfer of Care Note  Patient: Brandi Waters  Procedure(s) Performed: OPEN REDUCTION INTERNAL FIXATION RIGHT ANKLE, POSSIBLE SYNDESMOSIS (Right Ankle)  Patient Location: PACU  Anesthesia Type:General and Regional  Level of Consciousness: awake, alert  and oriented  Airway & Oxygen Therapy: Patient Spontanous Breathing and Patient connected to face mask oxygen  Post-op Assessment: Report given to RN and Post -op Vital signs reviewed and stable  Post vital signs: Reviewed and stable  Last Vitals:  Vitals Value Taken Time  BP    Temp    Pulse    Resp    SpO2      Last Pain:  Vitals:   04/13/20 0704  TempSrc: Oral  PainSc: 1       Patients Stated Pain Goal: 5 (04/13/20 0704)  Complications: No complications documented.

## 2020-04-13 NOTE — H&P (Signed)
PREOPERATIVE H&P  Chief Complaint: Right ankle pain  HPI: Brandi Waters is a 64 y.o. female who presents for preoperative history and physical with a diagnosis of right trimalleolar ankle fracture dislocation.  She was seen in the emergency department where closed reduction was performed.  She was seen in my office and given the nature of her fracture was indicated for surgery.  She is here today for surgery.  She was cleared by her cardiologist and primary care physician to proceed with surgery.  She does take Eliquis but has stopped this prior to surgery.. Symptoms are rated as moderate to severe, and have been worsening.  This is significantly impairing activities of daily living.  She has elected for surgical management.   Past Medical History:  Diagnosis Date  . Anxiety   . Closed right trimalleolar fracture   . Depression   . GERD (gastroesophageal reflux disease)   . Hyperlipidemia   . Obesity   . PAF (paroxysmal atrial fibrillation) (HCC)   . Systemic hypertension    Past Surgical History:  Procedure Laterality Date  . ABDOMINAL HYSTERECTOMY    . CARDIOVASCULAR STRESS TEST  10/27/2011   Negative Bruce Protocol  . EYE SURGERY Left    cataract  . KIDNEY STONE SURGERY  1997  . US ECHOCARDIOGRAPHY  10/27/2011   trace TR, RV systolic function borderline reduced.   Social History   Socioeconomic History  . Marital status: Married    Spouse name: Not on file  . Number of children: Not on file  . Years of education: Not on file  . Highest education level: Not on file  Occupational History  . Not on file  Tobacco Use  . Smoking status: Never Smoker  . Smokeless tobacco: Never Used  Substance and Sexual Activity  . Alcohol use: Not Currently    Alcohol/week: 0.0 standard drinks  . Drug use: No  . Sexual activity: Not on file  Other Topics Concern  . Not on file  Social History Narrative  . Not on file   Social Determinants of Health   Financial Resource Strain:    . Difficulty of Paying Living Expenses: Not on file  Food Insecurity:   . Worried About Programme researcher, broadcasting/film/video in the Last Year: Not on file  . Ran Out of Food in the Last Year: Not on file  Transportation Needs:   . Lack of Transportation (Medical): Not on file  . Lack of Transportation (Non-Medical): Not on file  Physical Activity:   . Days of Exercise per Week: Not on file  . Minutes of Exercise per Session: Not on file  Stress:   . Feeling of Stress : Not on file  Social Connections:   . Frequency of Communication with Friends and Family: Not on file  . Frequency of Social Gatherings with Friends and Family: Not on file  . Attends Religious Services: Not on file  . Active Member of Clubs or Organizations: Not on file  . Attends Banker Meetings: Not on file  . Marital Status: Not on file   Family History  Problem Relation Age of Onset  . Cancer Mother        breast  . Cancer Father        bladder  . Hypertension Father    Allergies  Allergen Reactions  . Atorvastatin Nausea Only    GL upset   Prior to Admission medications   Medication Sig Start Date End Date Taking? Authorizing  Provider  acetaminophen (TYLENOL) 500 MG tablet Take 1,000 mg by mouth every 6 (six) hours as needed for moderate pain.   Yes [provider]  ALPRAZolam Prudy Feeler) 0.5 MG tablet Take 0.5-1 mg by mouth 2 (two) times daily as needed for anxiety. 03/12/20  Yes [provider]  apixaban (ELIQUIS) 5 MG TABS tablet Take 1 tablet (5 mg total) by mouth 2 (two) times daily. 12/16/19  Yes Croitoru, Mihai, MD  flecainide (TAMBOCOR) 50 MG tablet TAKE 1 TABLET(50 MG) BY MOUTH TWICE DAILY 11/12/19  Yes Croitoru, Mihai, MD  metoprolol succinate (TOPROL-XL) 50 MG 24 hr tablet Take 1 tablet (50 mg total) by mouth 2 (two) times daily. Take with or immediately following a meal. 04/06/20  Yes Dyann Kief, PA-C  oxyCODONE-acetaminophen (PERCOCET/ROXICET) 5-325 MG tablet Take 1 tablet by  mouth every 6 (six) hours as needed for severe pain (pain). 03/31/20  Yes Margarita Grizzle, MD  sertraline (ZOLOFT) 100 MG tablet Take 100 mg by mouth daily. 02/17/20  Yes [provider]     Positive ROS: All other systems have been reviewed and were otherwise negative with the exception of those mentioned in the HPI and as above.  Physical Exam:  Vitals:   04/13/20 0825 04/13/20 0830  BP: (!) 130/57 (!) 123/57  Pulse: 66 68  Resp: 13 (!) 9  Temp:    SpO2: 98% 100%   General: Alert, no acute distress Cardiovascular: No pedal edema Respiratory: No cyanosis, no use of accessory musculature GI: No organomegaly, abdomen is soft and non-tender Skin: No lesions in the area of chief complaint Neurologic: Sensation intact distally Psychiatric: Patient is competent for consent with normal mood and affect Lymphatic: No axillary or cervical lymphadenopathy  MUSCULOSKELETAL: Right lower extremity in a short leg splint.  Clinically is acceptably aligned.  There is swelling about the toes.  She is able to wiggle toes.  Endorses sensation to light touch about the toes.  No evidence of bilateral upper extremity injury or left lower extremity injury.  Assessment: Right trimalleolar ankle fracture dislocation status post closed reduction   Plan: Plan for open treatment of her right trimalleolar ankle fracture without posterior fixation.  We will stress the syndesmosis as well and fix this if needed..  We discussed the risks, benefits and alternatives of surgery which include but are not limited to wound healing complications, infection, nonunion, malunion, need for further surgery, damage to surrounding structures and continued pain.  They understand there is no guarantees to an acceptable outcome.  After weighing these risks they opted to proceed with surgery.     Terance Hart, MD    04/13/2020 8:39 AM

## 2020-04-13 NOTE — Anesthesia Procedure Notes (Signed)
Anesthesia Regional Block: Adductor canal block   Pre-Anesthetic Checklist: ,, timeout performed, Correct Patient, Correct Site, Correct Laterality, Correct Procedure, Correct Position, site marked, Risks and benefits discussed, pre-op evaluation,  At surgeon's request and post-op pain management  Laterality: Right  Prep: Maximum Sterile Barrier Precautions used, chloraprep       Needles:  Injection technique: Single-shot  Needle Type: Echogenic Stimulator Needle     Needle Length: 9cm  Needle Gauge: 21     Additional Needles:   Procedures:,,,, ultrasound used (permanent image in chart),,,,  Narrative:  Start time: 04/13/2020 8:20 AM End time: 04/13/2020 8:25 AM Injection made incrementally with aspirations every 5 mL.  Performed by: Personally  Anesthesiologist: Kipp Brood, MD  Additional Notes: 15 cc 0.75% Ropivacaine injected easily

## 2020-04-13 NOTE — Progress Notes (Signed)
Assisted Dr. Noreene Larsson with right, ultrasound guided, popliteal, adductor canal block. Side rails up, monitors on throughout procedure. See vital signs in flow sheet. Tolerated Procedure well.

## 2020-04-13 NOTE — Op Note (Signed)
Brandi Waters female 64 y.o. 04/13/2020  PreOperative Diagnosis: Right trimalleolar ankle fractrue  PostOperative Diagnosis: Right trimalleolar ankle fracture  PROCEDURE: ORIF right trimalleolar ankle fracture without posterior fixation Ankle stress view fluoroscopy  SURGEON: Dub Mikes, MD  ASSISTANT: None  ANESTHESIA: General with peripheral nerve blockade using Exparel  FINDINGS: Comminuted and displaced right trimalleolar ankle fracture  IMPLANTS: Arthrex distal fibular locking plate and fully threaded 3.5 millimeter screws  INDICATIONS:64 y.o. female sustained an ankle fracture approximately 1 and 1/2 weeks ago.  She was indicated for surgery given the instability of her fracture.  She was seen in the emergency department initially where closed reduction was performed.  She was seen by her cardiologist and cleared for surgery.  Patient did take Eliquis and stopped it prior to surgery.  Patient understood the risks, benefits and alternatives to surgery which include but are not limited to wound healing complications, infection, nonunion, malunion, need for further surgery as well as damage to surrounding structures. They also understood the potential for continued pain in that there were no guarantees of acceptable outcome After weighing these risks the patient opted to proceed with surgery.  PROCEDURE: Patient was identified in the preoperative holding area.  The right leg was marked by myself.  Consent was signed by myself and the patient.  Block was performed by anesthesia in the preoperative holding area.  Patient was taken to the operative suite and placed supine on the operative table.  General LMA anesthesia was induced without difficulty. Bump was placed under the operative hip and bone foam was used.  All bony prominences were well padded.  Tourniquet was placed on the operative thigh.  Preoperative antibiotics were given. The extremity was prepped and draped in  the usual sterile fashion and surgical timeout was performed.  The limb was elevated and the tourniquet was inflated to 250 mmHg.  We began by making a longitudinal incision overlying the distal fibula.  This was taken sharply down through skin and subcutaneous tissue.  Blunt dissection was used to identify any branch of the superficial peroneal nerve which was not visualized in the surgical field.  The incision was then taken sharply down to bone and the fracture site was identified.  The fracture site was mobilized.   The fracture site were cleaned with a rondure and curette of any fracture hematoma and callus formation.  There was difficulty in reducing the fracture therefore we turned our attention to the medial malleolus. There was displacement of the medial malleolus and concern for interposed fracture fragmentation or soft tissue within the medial gutter disallowing appropriate reduction of the lateral malleolus.  And therefore an incision was made overlying this.  This was taken sharply down through skin and subcutaneous tissue.  Bovie cautery was used for skin bleeders.  Then sharp dissection down to the fracture and the fracture site was identified.  The soft tissue flap was carried anteriorly to identify the medial gutter of the ankle joint.  Then the fracture site was mobilized and using a curette and rondure the fracture was cleared out of hematoma and fracture callus.  There was comminution proximally about the fracture.  We were able to.  To the medial malleolus and there was cartilage damage to the medial talar dome.  The joint was irrigated copiously with normal saline.  Then the fracture was reduced and held provisionally with a pointed reduction forcep.  This was done under direct visualization.  Then fluoroscopy confirmed adequate reduction.  Then 2  K wires were placed across the fracture site.  We then turned our attention back to the fibula.  Then the fracture of the fibula was reduced under  direct visualization and held provisionally with a lobster claw.  Then fluoroscopy confirmed adequate reduction of the ankle mortise at that time.  Then a combination of locking and nonlocking screws were used after placement of a lag screw across the fracture by technique.  This provided good stability of the distal fibula fracture.  We then turned our attention to the medial malleolus.   2 3.5 mm fully threaded screws were placed across the fracture site with good fixation.  We decided easily threaded screws to avoid over compression due to the proximal comminution of the fracture.  Then under fluoroscopy the ankle was stressed and found to be stable with regard to medial clear space widening or syndesmotic widening.  The wounds were irrigated and the deep tissue was closed with a 3-0 monocryl.  The subcuticular tissue was closed with 3-0 Monocryl and the skin with staples.  The tourniquet was released.  Xeroform placed on the wounds as well as 4 x 4's and sterile sheet cotton.  She was placed in a nonweightbearing short leg splint.  She tolerated this well.  There were no complications.  She was awakened from anesthesia and taken recovery in stable condition.  POST OPERATIVE INSTRUCTIONS: Nonweightbearing on operative extremity Keep splint dry and limb elevated Continue Eliquis for DVT prophylaxis.  May restart tomorrow. Call the office with concerns She will follow-up in 2 weeks for splint removal, x-rays of the operative ankle, nonweightbearing and suture removal if appropriate.   TOURNIQUET TIME: 65 minutes  BLOOD LOSS:  Minimal         DRAINS: none         SPECIMEN: none       COMPLICATIONS:  * No complications entered in OR log *         Disposition: PACU - hemodynamically stable.         Condition: stable

## 2020-04-13 NOTE — Anesthesia Postprocedure Evaluation (Signed)
Anesthesia Post Note  Patient: Brandi Waters  Procedure(s) Performed: OPEN REDUCTION INTERNAL FIXATION RIGHT ANKLE, POSSIBLE SYNDESMOSIS (Right Ankle)     Patient location during evaluation: PACU Anesthesia Type: General and Regional Level of consciousness: awake and alert Pain management: pain level controlled Vital Signs Assessment: post-procedure vital signs reviewed and stable Respiratory status: spontaneous breathing, nonlabored ventilation, respiratory function stable and patient connected to nasal cannula oxygen Cardiovascular status: blood pressure returned to baseline and stable Postop Assessment: no apparent nausea or vomiting Anesthetic complications: no   No complications documented.  Last Vitals:  Vitals:   04/13/20 1100 04/13/20 1109  BP: (!) 123/58 (!) (P) 124/54  Pulse: 71 (P) 65  Resp: 16 (P) 20  Temp:  (P) 36.9 C  SpO2: 96% (P) 96%    Last Pain:  Vitals:   04/13/20 1109  TempSrc:   PainSc: (P) 0-No pain                 Brandi Waters

## 2020-04-13 NOTE — Anesthesia Preprocedure Evaluation (Signed)
Anesthesia Evaluation  Patient identified by MRN, date of birth, ID band Patient awake    Reviewed: Allergy & Precautions, NPO status , Patient's Chart, lab work & pertinent test results  Airway Mallampati: II  TM Distance: >3 FB Neck ROM: Full    Dental  (+) Teeth Intact, Dental Advisory Given   Pulmonary    breath sounds clear to auscultation       Cardiovascular hypertension,  Rhythm:Regular Rate:Normal     Neuro/Psych    GI/Hepatic   Endo/Other    Renal/GU      Musculoskeletal   Abdominal   Peds  Hematology   Anesthesia Other Findings   Reproductive/Obstetrics                             Anesthesia Physical Anesthesia Plan  ASA: II  Anesthesia Plan: General and Regional   Post-op Pain Management:    Induction: Intravenous  PONV Risk Score and Plan: Ondansetron and Dexamethasone  Airway Management Planned: LMA  Additional Equipment:   Intra-op Plan:   Post-operative Plan:   Informed Consent: I have reviewed the patients History and Physical, chart, labs and discussed the procedure including the risks, benefits and alternatives for the proposed anesthesia with the patient or authorized representative who has indicated his/her understanding and acceptance.     Dental advisory given  Plan Discussed with: CRNA and Anesthesiologist  Anesthesia Plan Comments:         Anesthesia Quick Evaluation

## 2020-04-13 NOTE — Anesthesia Procedure Notes (Signed)
Procedure Name: LMA Insertion Date/Time: 04/13/2020 9:00 AM Performed by: Karen Kitchens, CRNA Pre-anesthesia Checklist: Patient identified, Emergency Drugs available, Suction available and Patient being monitored Patient Re-evaluated:Patient Re-evaluated prior to induction Oxygen Delivery Method: Circle system utilized Preoxygenation: Pre-oxygenation with 100% oxygen Induction Type: IV induction Ventilation: Mask ventilation without difficulty LMA: LMA inserted LMA Size: 4.0 Number of attempts: 1 Airway Equipment and Method: Bite block Placement Confirmation: positive ETCO2 Tube secured with: Tape Dental Injury: Teeth and Oropharynx as per pre-operative assessment

## 2020-04-13 NOTE — Anesthesia Procedure Notes (Signed)
Anesthesia Regional Block: Popliteal block   Pre-Anesthetic Checklist: ,, timeout performed, Correct Patient, Correct Site, Correct Laterality, Correct Procedure, Correct Position, site marked, Risks and benefits discussed,  Surgical consent,  Pre-op evaluation,  At surgeon's request and post-op pain management  Laterality: Right  Prep: chloraprep       Needles:  Injection technique: Single-shot  Needle Type: Stimulator Needle - 40      Needle Gauge: 22     Additional Needles:   Procedures:, nerve stimulator,,,,,,,  Narrative:  Start time: 04/13/2020 8:15 AM End time: 04/13/2020 8:20 AM Injection made incrementally with aspirations every 5 mL.  Performed by: Personally   Additional Notes: 30 cc 0.5% Bupivacaine 1:200 epi 10 cc 1.3% Exparel

## 2020-04-14 ENCOUNTER — Encounter (HOSPITAL_BASED_OUTPATIENT_CLINIC_OR_DEPARTMENT_OTHER): Payer: Self-pay | Admitting: Orthopaedic Surgery

## 2020-04-29 DIAGNOSIS — R7301 Impaired fasting glucose: Secondary | ICD-10-CM | POA: Diagnosis not present

## 2020-04-29 DIAGNOSIS — F411 Generalized anxiety disorder: Secondary | ICD-10-CM | POA: Diagnosis not present

## 2020-04-29 DIAGNOSIS — E7800 Pure hypercholesterolemia, unspecified: Secondary | ICD-10-CM | POA: Diagnosis not present

## 2020-04-29 DIAGNOSIS — I480 Paroxysmal atrial fibrillation: Secondary | ICD-10-CM | POA: Diagnosis not present

## 2020-04-30 DIAGNOSIS — S82851A Displaced trimalleolar fracture of right lower leg, initial encounter for closed fracture: Secondary | ICD-10-CM | POA: Diagnosis not present

## 2020-05-26 DIAGNOSIS — S82851A Displaced trimalleolar fracture of right lower leg, initial encounter for closed fracture: Secondary | ICD-10-CM | POA: Diagnosis not present

## 2020-06-01 DIAGNOSIS — R2689 Other abnormalities of gait and mobility: Secondary | ICD-10-CM | POA: Diagnosis not present

## 2020-06-01 DIAGNOSIS — M24871 Other specific joint derangements of right ankle, not elsewhere classified: Secondary | ICD-10-CM | POA: Diagnosis not present

## 2020-06-01 DIAGNOSIS — Z9889 Other specified postprocedural states: Secondary | ICD-10-CM | POA: Diagnosis not present

## 2020-06-01 DIAGNOSIS — M25571 Pain in right ankle and joints of right foot: Secondary | ICD-10-CM | POA: Diagnosis not present

## 2020-06-04 DIAGNOSIS — Z9889 Other specified postprocedural states: Secondary | ICD-10-CM | POA: Diagnosis not present

## 2020-06-04 DIAGNOSIS — M25571 Pain in right ankle and joints of right foot: Secondary | ICD-10-CM | POA: Diagnosis not present

## 2020-06-04 DIAGNOSIS — R2689 Other abnormalities of gait and mobility: Secondary | ICD-10-CM | POA: Diagnosis not present

## 2020-06-04 DIAGNOSIS — M24871 Other specific joint derangements of right ankle, not elsewhere classified: Secondary | ICD-10-CM | POA: Diagnosis not present

## 2020-06-07 DIAGNOSIS — R2689 Other abnormalities of gait and mobility: Secondary | ICD-10-CM | POA: Diagnosis not present

## 2020-06-07 DIAGNOSIS — M24871 Other specific joint derangements of right ankle, not elsewhere classified: Secondary | ICD-10-CM | POA: Diagnosis not present

## 2020-06-07 DIAGNOSIS — M25571 Pain in right ankle and joints of right foot: Secondary | ICD-10-CM | POA: Diagnosis not present

## 2020-06-07 DIAGNOSIS — Z9889 Other specified postprocedural states: Secondary | ICD-10-CM | POA: Diagnosis not present

## 2020-06-11 DIAGNOSIS — M25571 Pain in right ankle and joints of right foot: Secondary | ICD-10-CM | POA: Diagnosis not present

## 2020-06-11 DIAGNOSIS — M24871 Other specific joint derangements of right ankle, not elsewhere classified: Secondary | ICD-10-CM | POA: Diagnosis not present

## 2020-06-11 DIAGNOSIS — R2689 Other abnormalities of gait and mobility: Secondary | ICD-10-CM | POA: Diagnosis not present

## 2020-06-11 DIAGNOSIS — Z9889 Other specified postprocedural states: Secondary | ICD-10-CM | POA: Diagnosis not present

## 2020-06-16 DIAGNOSIS — K625 Hemorrhage of anus and rectum: Secondary | ICD-10-CM | POA: Diagnosis not present

## 2020-06-18 DIAGNOSIS — R2689 Other abnormalities of gait and mobility: Secondary | ICD-10-CM | POA: Diagnosis not present

## 2020-06-18 DIAGNOSIS — M25571 Pain in right ankle and joints of right foot: Secondary | ICD-10-CM | POA: Diagnosis not present

## 2020-06-18 DIAGNOSIS — M24871 Other specific joint derangements of right ankle, not elsewhere classified: Secondary | ICD-10-CM | POA: Diagnosis not present

## 2020-06-18 DIAGNOSIS — Z9889 Other specified postprocedural states: Secondary | ICD-10-CM | POA: Diagnosis not present

## 2020-06-21 DIAGNOSIS — M25571 Pain in right ankle and joints of right foot: Secondary | ICD-10-CM | POA: Diagnosis not present

## 2020-06-21 DIAGNOSIS — Z9889 Other specified postprocedural states: Secondary | ICD-10-CM | POA: Diagnosis not present

## 2020-06-21 DIAGNOSIS — S82851A Displaced trimalleolar fracture of right lower leg, initial encounter for closed fracture: Secondary | ICD-10-CM | POA: Diagnosis not present

## 2020-06-21 DIAGNOSIS — M24871 Other specific joint derangements of right ankle, not elsewhere classified: Secondary | ICD-10-CM | POA: Diagnosis not present

## 2020-06-21 DIAGNOSIS — R2689 Other abnormalities of gait and mobility: Secondary | ICD-10-CM | POA: Diagnosis not present

## 2020-06-24 DIAGNOSIS — M25571 Pain in right ankle and joints of right foot: Secondary | ICD-10-CM | POA: Diagnosis not present

## 2020-06-24 DIAGNOSIS — R2689 Other abnormalities of gait and mobility: Secondary | ICD-10-CM | POA: Diagnosis not present

## 2020-06-24 DIAGNOSIS — Z9889 Other specified postprocedural states: Secondary | ICD-10-CM | POA: Diagnosis not present

## 2020-06-24 DIAGNOSIS — M24871 Other specific joint derangements of right ankle, not elsewhere classified: Secondary | ICD-10-CM | POA: Diagnosis not present

## 2020-07-06 DIAGNOSIS — M24871 Other specific joint derangements of right ankle, not elsewhere classified: Secondary | ICD-10-CM | POA: Diagnosis not present

## 2020-07-06 DIAGNOSIS — M25571 Pain in right ankle and joints of right foot: Secondary | ICD-10-CM | POA: Diagnosis not present

## 2020-07-06 DIAGNOSIS — Z9889 Other specified postprocedural states: Secondary | ICD-10-CM | POA: Diagnosis not present

## 2020-07-06 DIAGNOSIS — R2689 Other abnormalities of gait and mobility: Secondary | ICD-10-CM | POA: Diagnosis not present

## 2020-07-07 DIAGNOSIS — H40013 Open angle with borderline findings, low risk, bilateral: Secondary | ICD-10-CM | POA: Diagnosis not present

## 2020-07-08 DIAGNOSIS — Z9889 Other specified postprocedural states: Secondary | ICD-10-CM | POA: Diagnosis not present

## 2020-07-08 DIAGNOSIS — R2689 Other abnormalities of gait and mobility: Secondary | ICD-10-CM | POA: Diagnosis not present

## 2020-07-08 DIAGNOSIS — M25571 Pain in right ankle and joints of right foot: Secondary | ICD-10-CM | POA: Diagnosis not present

## 2020-07-08 DIAGNOSIS — M24871 Other specific joint derangements of right ankle, not elsewhere classified: Secondary | ICD-10-CM | POA: Diagnosis not present

## 2020-07-12 DIAGNOSIS — R2689 Other abnormalities of gait and mobility: Secondary | ICD-10-CM | POA: Diagnosis not present

## 2020-07-12 DIAGNOSIS — M25571 Pain in right ankle and joints of right foot: Secondary | ICD-10-CM | POA: Diagnosis not present

## 2020-07-12 DIAGNOSIS — M24871 Other specific joint derangements of right ankle, not elsewhere classified: Secondary | ICD-10-CM | POA: Diagnosis not present

## 2020-07-12 DIAGNOSIS — Z9889 Other specified postprocedural states: Secondary | ICD-10-CM | POA: Diagnosis not present

## 2020-07-13 ENCOUNTER — Other Ambulatory Visit: Payer: Self-pay | Admitting: Cardiovascular Disease

## 2020-07-13 NOTE — Telephone Encounter (Signed)
Prescription refill request for Eliquis received. Indication:atrial fibrillation Last office visit:10/21 lenze Scr:0.60  9/21 Age: 65 Weight:98.3 kg  Prescription refilled

## 2020-07-14 DIAGNOSIS — Z9889 Other specified postprocedural states: Secondary | ICD-10-CM | POA: Diagnosis not present

## 2020-07-14 DIAGNOSIS — M25571 Pain in right ankle and joints of right foot: Secondary | ICD-10-CM | POA: Diagnosis not present

## 2020-07-14 DIAGNOSIS — M24871 Other specific joint derangements of right ankle, not elsewhere classified: Secondary | ICD-10-CM | POA: Diagnosis not present

## 2020-07-14 DIAGNOSIS — R2689 Other abnormalities of gait and mobility: Secondary | ICD-10-CM | POA: Diagnosis not present

## 2020-07-22 ENCOUNTER — Ambulatory Visit: Payer: BC Managed Care – PPO | Admitting: Cardiovascular Disease

## 2020-07-22 ENCOUNTER — Other Ambulatory Visit: Payer: Self-pay

## 2020-07-22 ENCOUNTER — Encounter: Payer: Self-pay | Admitting: Cardiovascular Disease

## 2020-07-22 VITALS — BP 138/74 | HR 65 | Ht 66.0 in | Wt 216.0 lb

## 2020-07-22 DIAGNOSIS — Z7901 Long term (current) use of anticoagulants: Secondary | ICD-10-CM

## 2020-07-22 DIAGNOSIS — I480 Paroxysmal atrial fibrillation: Secondary | ICD-10-CM

## 2020-07-22 DIAGNOSIS — E669 Obesity, unspecified: Secondary | ICD-10-CM | POA: Diagnosis not present

## 2020-07-22 DIAGNOSIS — Z79899 Other long term (current) drug therapy: Secondary | ICD-10-CM

## 2020-07-22 DIAGNOSIS — R030 Elevated blood-pressure reading, without diagnosis of hypertension: Secondary | ICD-10-CM

## 2020-07-22 DIAGNOSIS — Z5181 Encounter for therapeutic drug level monitoring: Secondary | ICD-10-CM

## 2020-07-22 MED ORDER — FLECAINIDE ACETATE 50 MG PO TABS: ORAL_TABLET | ORAL | 3 refills | Status: DC
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
## 2020-07-22 NOTE — Patient Instructions (Signed)

## 2020-07-26 DIAGNOSIS — R2689 Other abnormalities of gait and mobility: Secondary | ICD-10-CM | POA: Diagnosis not present

## 2020-07-26 DIAGNOSIS — M25571 Pain in right ankle and joints of right foot: Secondary | ICD-10-CM | POA: Diagnosis not present

## 2020-07-26 DIAGNOSIS — E7800 Pure hypercholesterolemia, unspecified: Secondary | ICD-10-CM | POA: Diagnosis not present

## 2020-07-26 DIAGNOSIS — R7301 Impaired fasting glucose: Secondary | ICD-10-CM | POA: Diagnosis not present

## 2020-07-26 DIAGNOSIS — M24871 Other specific joint derangements of right ankle, not elsewhere classified: Secondary | ICD-10-CM | POA: Diagnosis not present

## 2020-07-26 DIAGNOSIS — Z9889 Other specified postprocedural states: Secondary | ICD-10-CM | POA: Diagnosis not present

## 2020-07-28 DIAGNOSIS — Z9889 Other specified postprocedural states: Secondary | ICD-10-CM | POA: Diagnosis not present

## 2020-07-28 DIAGNOSIS — M24871 Other specific joint derangements of right ankle, not elsewhere classified: Secondary | ICD-10-CM | POA: Diagnosis not present

## 2020-07-28 DIAGNOSIS — M25571 Pain in right ankle and joints of right foot: Secondary | ICD-10-CM | POA: Diagnosis not present

## 2020-07-28 DIAGNOSIS — R2689 Other abnormalities of gait and mobility: Secondary | ICD-10-CM | POA: Diagnosis not present

## 2020-08-02 DIAGNOSIS — M25571 Pain in right ankle and joints of right foot: Secondary | ICD-10-CM | POA: Diagnosis not present

## 2020-08-03 DIAGNOSIS — M25571 Pain in right ankle and joints of right foot: Secondary | ICD-10-CM | POA: Diagnosis not present

## 2020-08-03 DIAGNOSIS — Z9889 Other specified postprocedural states: Secondary | ICD-10-CM | POA: Diagnosis not present

## 2020-08-03 DIAGNOSIS — R2689 Other abnormalities of gait and mobility: Secondary | ICD-10-CM | POA: Diagnosis not present

## 2020-08-03 DIAGNOSIS — M24871 Other specific joint derangements of right ankle, not elsewhere classified: Secondary | ICD-10-CM | POA: Diagnosis not present

## 2020-08-05 DIAGNOSIS — R2689 Other abnormalities of gait and mobility: Secondary | ICD-10-CM | POA: Diagnosis not present

## 2020-08-05 DIAGNOSIS — M24871 Other specific joint derangements of right ankle, not elsewhere classified: Secondary | ICD-10-CM | POA: Diagnosis not present

## 2020-08-05 DIAGNOSIS — Z9889 Other specified postprocedural states: Secondary | ICD-10-CM | POA: Diagnosis not present

## 2020-08-05 DIAGNOSIS — M25571 Pain in right ankle and joints of right foot: Secondary | ICD-10-CM | POA: Diagnosis not present

## 2020-08-10 DIAGNOSIS — Z9889 Other specified postprocedural states: Secondary | ICD-10-CM | POA: Diagnosis not present

## 2020-08-10 DIAGNOSIS — M25571 Pain in right ankle and joints of right foot: Secondary | ICD-10-CM | POA: Diagnosis not present

## 2020-08-10 DIAGNOSIS — M24871 Other specific joint derangements of right ankle, not elsewhere classified: Secondary | ICD-10-CM | POA: Diagnosis not present

## 2020-08-10 DIAGNOSIS — R2689 Other abnormalities of gait and mobility: Secondary | ICD-10-CM | POA: Diagnosis not present

## 2020-08-12 DIAGNOSIS — R2689 Other abnormalities of gait and mobility: Secondary | ICD-10-CM | POA: Diagnosis not present

## 2020-08-12 DIAGNOSIS — Z9889 Other specified postprocedural states: Secondary | ICD-10-CM | POA: Diagnosis not present

## 2020-08-12 DIAGNOSIS — M25571 Pain in right ankle and joints of right foot: Secondary | ICD-10-CM | POA: Diagnosis not present

## 2020-08-12 DIAGNOSIS — M24871 Other specific joint derangements of right ankle, not elsewhere classified: Secondary | ICD-10-CM | POA: Diagnosis not present

## 2020-08-24 DIAGNOSIS — H2511 Age-related nuclear cataract, right eye: Secondary | ICD-10-CM | POA: Diagnosis not present

## 2020-08-24 DIAGNOSIS — H353131 Nonexudative age-related macular degeneration, bilateral, early dry stage: Secondary | ICD-10-CM | POA: Diagnosis not present

## 2020-08-24 DIAGNOSIS — Z961 Presence of intraocular lens: Secondary | ICD-10-CM | POA: Diagnosis not present

## 2020-08-24 DIAGNOSIS — H26492 Other secondary cataract, left eye: Secondary | ICD-10-CM | POA: Diagnosis not present

## 2020-08-31 DIAGNOSIS — Z9842 Cataract extraction status, left eye: Secondary | ICD-10-CM | POA: Diagnosis not present

## 2020-09-09 ENCOUNTER — Telehealth: Payer: Self-pay | Admitting: Cardiovascular Disease

## 2020-09-09 NOTE — Telephone Encounter (Signed)
   [facility] Pre-operative Risk Assessment    HEARTCARE STAFF: - Please ensure there is not already an duplicate clearance open for this procedure. - Under Visit Info/Reason for Call, type in Other and utilize the format Clearance MM/DD/YY or Clearance TBD. Do not use dashes or single digits. - If request is for dental extraction, please clarify the # of teeth to be extracted.  Request for surgical clearance:  1. What type of surgery is being performed? Pt is having 2 lower teeth removed   2. When is this surgery scheduled? 09/15/20   3. What type of clearance is required (medical clearance vs. Pharmacy clearance to hold med vs. Both)? Pharmacy   Are there any medications that need to be held prior to surgery and how long? ELIQUIS 5 MG TABS tablet [[id number]]   4. Practice name and name of physician performing surgery? [facility]    5. What is the office phone number? [phone] are not open on fridays    7.   What is the office fax number? [phone]  8.   Anesthesia type (None, local, MAC, general) ? Local    Brandi Waters 09/09/2020, 2:21 PM  _________________________________________________________________   (provider comments below)

## 2020-09-09 NOTE — Telephone Encounter (Signed)
   Primary Cardiologist: Thurmon Fair, MD  Chart reviewed as part of pre-operative protocol coverage. Simple dental extractions are considered low risk procedures per guidelines and generally do not require any specific cardiac clearance. It is also generally accepted that for simple extractions and dental cleanings, there is no need to interrupt blood thinner therapy.   SBE prophylaxis is not required for the patient.  I will route this recommendation to the requesting party via Epic fax function and remove from pre-op pool.  Please call with questions.  Ronney Asters, NP 09/09/2020, 2:34 PM

## 2020-10-18 ENCOUNTER — Other Ambulatory Visit: Payer: Self-pay | Admitting: Physician Assistant

## 2020-10-20 NOTE — Telephone Encounter (Signed)
This is Dr. Croitoru's pt 

## 2021-02-18 DIAGNOSIS — R059 Cough, unspecified: Secondary | ICD-10-CM | POA: Diagnosis not present

## 2021-02-24 DIAGNOSIS — J019 Acute sinusitis, unspecified: Secondary | ICD-10-CM | POA: Diagnosis not present

## 2021-03-16 ENCOUNTER — Other Ambulatory Visit: Payer: Self-pay

## 2021-03-16 ENCOUNTER — Encounter (INDEPENDENT_AMBULATORY_CARE_PROVIDER_SITE_OTHER): Payer: BC Managed Care – PPO | Admitting: Ophthalmology

## 2021-03-16 DIAGNOSIS — H35371 Puckering of macula, right eye: Secondary | ICD-10-CM

## 2021-03-16 DIAGNOSIS — H353132 Nonexudative age-related macular degeneration, bilateral, intermediate dry stage: Secondary | ICD-10-CM | POA: Diagnosis not present

## 2021-03-16 DIAGNOSIS — H43813 Vitreous degeneration, bilateral: Secondary | ICD-10-CM | POA: Diagnosis not present

## 2021-03-17 ENCOUNTER — Other Ambulatory Visit: Payer: Self-pay

## 2021-03-17 MED ORDER — APIXABAN 5 MG PO TABS
5.0000 mg | ORAL_TABLET | Freq: Two times a day (BID) | ORAL | 1 refills | Status: DC
Start: 2021-03-17 — End: 2022-04-03

## 2021-03-17 NOTE — Telephone Encounter (Signed)
Prescription refill request for Eliquis received. Indication:AFIB Last office visit:CROITORU 07/22/20 Scr: 0.6 03/31/20 Age: 68F Weight:98KG

## 2021-05-02 DIAGNOSIS — E7800 Pure hypercholesterolemia, unspecified: Secondary | ICD-10-CM | POA: Diagnosis not present

## 2021-05-02 DIAGNOSIS — Z0000 Encounter for general adult medical examination without abnormal findings: Secondary | ICD-10-CM | POA: Diagnosis not present

## 2021-05-02 DIAGNOSIS — R7301 Impaired fasting glucose: Secondary | ICD-10-CM | POA: Diagnosis not present

## 2021-05-02 DIAGNOSIS — Z23 Encounter for immunization: Secondary | ICD-10-CM | POA: Diagnosis not present

## 2021-07-01 ENCOUNTER — Telehealth: Payer: Self-pay | Admitting: Cardiovascular Disease

## 2021-07-01 MED ORDER — METOPROLOL SUCCINATE ER 50 MG PO TB24: ORAL_TABLET | ORAL | 3 refills | Status: DC

## 2021-07-01 NOTE — Telephone Encounter (Signed)
°*  STAT* If patient is at the pharmacy, call can be transferred to refill team.   1. Which medications need to be refilled? (please list name of each medication and dose if known)  Metoprolol  2. Which pharmacy/location (including street and city if local pharmacy) is medication to be sent to? [facility] [place] [address] [place]   3. Do they need a 30 day or 90 day supply? 90 days and refills

## 2021-07-15 ENCOUNTER — Other Ambulatory Visit: Payer: Self-pay | Admitting: Cardiovascular Disease

## 2021-07-24 NOTE — Progress Notes (Signed)
Cardiology Office Note:    Date:  08/02/2021   ID:  Brandi Waters, DOB [birth year], MRN [id number]  PCP:  Blair Heys, MD   [facility] Providers Cardiologist:  Thurmon Fair, MD { Referring MD: Blair Heys, MD   Chief Complaint  Patient presents with   Follow-up    PAF, PVCs    History of Present Illness:    Brandi Waters is a 66 y.o. female with a hx of "lone" Afib in the absence of other cardiac abnormalities. Hx of normal echo and stress test. She is maintained on flecainide and BB therapy and is anticoagulated with eliquis 5 mg BID. Palpitations are rare and generally associated with PVCs. PVCs have improved after she switched to toprol. She was last seen by Dr. Royann Shivers 07/22/20 and was doing well at that time. She was due for a follow up treadmill test (flecainide) - this was delayed last year due to her ankle injury and subsequent surgery. She did well after ankle surgery with Dr. Susa Simmonds 04/2020. She completed PT and is walking again.   She presents today for scheduled annual follow up. She is having a wisdom tooth removed in 2 weeks and would like to hold off on ETT until the spring. She denies chest pain. She is able to walk up a flight of stairs, but has not started a walking program yet. We discussed starting to walk 10 min daily on her days that she works from home.    Past Medical History:  Diagnosis Date   Anxiety    Closed right trimalleolar fracture    Depression    GERD (gastroesophageal reflux disease)    Hyperlipidemia    Obesity    PAF (paroxysmal atrial fibrillation) (HCC)    Systemic hypertension     Past Surgical History:  Procedure Laterality Date   ABDOMINAL HYSTERECTOMY     CARDIOVASCULAR STRESS TEST  10/27/2011   Negative Bruce Protocol   EYE SURGERY Left    cataract   KIDNEY STONE SURGERY  1997   ORIF ANKLE FRACTURE Right 04/13/2020   Procedure: OPEN REDUCTION INTERNAL FIXATION (ORIF) ANKLE FRACTURE;  Surgeon: Terance Hart,  MD;  Location: [facility];  Service: Orthopedics;  Laterality: Right;   SYNDESMOSIS REPAIR Right 04/13/2020   Procedure: SYNDESMOSIS REPAIR ANKLE;  Surgeon: Terance Hart, MD;  Location: [facility];  Service: Orthopedics;  Laterality: Right;   US ECHOCARDIOGRAPHY  10/27/2011   trace TR, RV systolic function borderline reduced.    Current Medications: Current Meds  Medication Sig   acetaminophen (TYLENOL) 500 MG tablet Take 1,000 mg by mouth every 6 (six) hours as needed for moderate pain.   ALPRAZolam (XANAX) 0.5 MG tablet Take 0.5-1 mg by mouth 2 (two) times daily as needed for anxiety.   apixaban (ELIQUIS) 5 MG TABS tablet Take 1 tablet (5 mg total) by mouth 2 (two) times daily.   flecainide (TAMBOCOR) 50 MG tablet TAKE 1 TABLET(50 MG) BY MOUTH TWICE DAILY   metoprolol succinate (TOPROL-XL) 50 MG 24 hr tablet TAKE 1 AND 1/2 TABLETS(75 MG) BY MOUTH TWICE DAILY WITH OR IMMEDIATELY FOLLOWING A MEAL   sertraline (ZOLOFT) 100 MG tablet Take 100 mg by mouth daily.     Allergies:   Atorvastatin   Social History   Socioeconomic History   Marital status: Married    Spouse name: Not on file   Number of children: Not on file   Years of education: Not on file  Highest education level: Not on file  Occupational History   Not on file  Tobacco Use   Smoking status: Never   Smokeless tobacco: Never  Substance and Sexual Activity   Alcohol use: Not Currently    Alcohol/week: 0.0 standard drinks   Drug use: No   Sexual activity: Not on file  Other Topics Concern   Not on file  Social History Narrative   Not on file   Social Determinants of Health   Financial Resource Strain: Not on file  Food Insecurity: Not on file  Transportation Needs: Not on file  Physical Activity: Not on file  Stress: Not on file  Social Connections: Not on file     Family History: The patient's family history includes Cancer in her father and mother; Hypertension in  her father.  ROS:   Please see the history of present illness.     All other systems reviewed and are negative.  EKGs/Labs/Other Studies Reviewed:    The following studies were reviewed today:  POET planned this spring  EKG:  EKG is  ordered today.  The ekg ordered today demonstrates sinus rhythm with HR 75, iRBBB  Recent Labs: No results found for requested labs within last 8760 hours.  Recent Lipid Panel No results found for: CHOL, TRIG, HDL, CHOLHDL, VLDL, LDLCALC, LDLDIRECT   Risk Assessment/Calculations:    CHA2DS2-VASc Score = 2   This indicates a 2.2% annual risk of stroke. The patient's score is based upon: CHF History: 0 HTN History: 0 Diabetes History: 0 Stroke History: 0 Vascular Disease History: 0 Age Score: 1 Gender Score: 1           Physical Exam:    VS:  BP 132/74    Pulse 75    Ht 5\' 6"  (1.676 m)    Wt 226 lb 6.4 oz (102.7 kg)    SpO2 97%    BMI 36.54 kg/m     Wt Readings from Last 3 Encounters:  08/02/21 226 lb 6.4 oz (102.7 kg)  07/22/20 216 lb (98 kg)  04/13/20 216 lb 11.4 oz (98.3 kg)     GEN:  Well nourished, well developed in no acute distress HEENT: Normal NECK: No JVD; No carotid bruits LYMPHATICS: No lymphadenopathy CARDIAC: RRR, no murmurs, rubs, gallops RESPIRATORY:  Clear to auscultation without rales, wheezing or rhonchi  ABDOMEN: Soft, non-tender, non-distended MUSCULOSKELETAL:  No edema; although right ankle now chronically swollen compared to left.   SKIN: Warm and dry NEUROLOGIC:  Alert and oriented x 3 PSYCHIATRIC:  Normal affect   ASSESSMENT:    1. Encounter for monitoring flecainide therapy   2. Paroxysmal atrial fibrillation (HCC)   3. Long term current use of anticoagulant   4. PVC (premature ventricular contraction)   5. Obesity (BMI 30-39.9)   6. Elevated blood pressure reading    PLAN:    In order of problems listed above:  Paroxysmal atrial fibrillation Chronic anticoagulation Continue flecainide and  toprol. No bleeding issues with eliquis.  Fewer palpitations lately. Overall doing very well.    Flecainide therapy Ischemic evaluation - will plan this spring.  QRS interval 102 ms, pattern of iRBBB   PVCs Continue toprol   Borderline Hypertension In past office visits.  BP today 132/74 I suspect this will improve with walking.    Obesity We discussed implementing a walking program - she will start with 10 min of walking on the days she works from home. I would like for her to start  walking on her ankle prior to treadmill test to make sure she can do the test. She states she has a bout a 10% deficit in her right ankle.    Tooth extraction She is planning to have one wisdom tooth extracted. She should not need to hold elquis for this.    Follow up with Dr. Sallyanne Kuster in 1 year.     Medication Adjustments/Labs and Tests Ordered: Current medicines are reviewed at length with the patient today.  Concerns regarding medicines are outlined above.  Orders Placed This Encounter  Procedures   EXERCISE TOLERANCE TEST (ETT)   EKG 12-Lead   No orders of the defined types were placed in this encounter.   Patient Instructions  Medication Instructions:  No Changes *If you need a refill on your cardiac medications before your next appointment, please call your pharmacy*   Lab Work: No Labs If you have labs (blood work) drawn today and your tests are completely normal, you will receive your results only by: [place] (if you have MyChart) OR A paper copy in the mail If you have any lab test that is abnormal or we need to change your treatment, we will call you to review the results.   Testing/Procedures: [address] [place] has requested that you have en exercise stress myoview. For further information please visit [email or web address]. Please follow instruction sheet, as given.    Follow-Up: At [facility], you and your health needs are  our priority.  As part of our continuing mission to provide you with exceptional heart care, we have created designated Provider Care Teams.  These Care Teams include your primary Cardiologist (physician) and Advanced Practice Providers (APPs -  Physician Assistants and Nurse Practitioners) who all work together to provide you with the care you need, when you need it.  We recommend signing up for the patient portal called "MyChart".  Sign up information is provided on this After Visit Summary.  MyChart is used to connect with patients for Virtual Visits (Telemedicine).  Patients are able to view lab/test results, encounter notes, upcoming appointments, etc.  Non-urgent messages can be sent to your provider as well.   To learn more about what you can do with MyChart, go to [email or web address].    Your next appointment:   1 year(s)  The format for your next appointment:   In Person  Provider:   Sanda Klein, MD     Other Instructions:  Hold Beta Blocker Night Before Test   Signed, Ledora Bottcher, [place]  08/02/2021 8:28 AM    [place]

## 2021-08-02 ENCOUNTER — Encounter: Payer: Self-pay | Admitting: Physician Assistant

## 2021-08-02 ENCOUNTER — Ambulatory Visit: Payer: BC Managed Care – PPO | Admitting: Physician Assistant

## 2021-08-02 ENCOUNTER — Other Ambulatory Visit: Payer: Self-pay

## 2021-08-02 VITALS — BP 132/74 | HR 75 | Ht 66.0 in | Wt 226.4 lb

## 2021-08-02 DIAGNOSIS — I480 Paroxysmal atrial fibrillation: Secondary | ICD-10-CM | POA: Diagnosis not present

## 2021-08-02 DIAGNOSIS — R030 Elevated blood-pressure reading, without diagnosis of hypertension: Secondary | ICD-10-CM

## 2021-08-02 DIAGNOSIS — Z79899 Other long term (current) drug therapy: Secondary | ICD-10-CM | POA: Diagnosis not present

## 2021-08-02 DIAGNOSIS — E669 Obesity, unspecified: Secondary | ICD-10-CM

## 2021-08-02 DIAGNOSIS — Z5181 Encounter for therapeutic drug level monitoring: Secondary | ICD-10-CM | POA: Diagnosis not present

## 2021-08-02 DIAGNOSIS — I493 Ventricular premature depolarization: Secondary | ICD-10-CM | POA: Diagnosis not present

## 2021-08-02 DIAGNOSIS — Z7901 Long term (current) use of anticoagulants: Secondary | ICD-10-CM | POA: Diagnosis not present

## 2021-08-02 NOTE — Patient Instructions (Addendum)
Medication Instructions:  No Changes *If you need a refill on your cardiac medications before your next appointment, please call your pharmacy*   Lab Work: No Labs If you have labs (blood work) drawn today and your tests are completely normal, you will receive your results only by: [place] (if you have MyChart) OR A paper copy in the mail If you have any lab test that is abnormal or we need to change your treatment, we will call you to review the results.   Testing/Procedures: [address] [place] has requested that you have en exercise stress myoview. For further information please visit [email or web address]. Please follow instruction sheet, as given.    Follow-Up: At [facility], you and your health needs are our priority.  As part of our continuing mission to provide you with exceptional heart care, we have created designated Provider Care Teams.  These Care Teams include your primary Cardiologist (physician) and Advanced Practice Providers (APPs -  Physician Assistants and Nurse Practitioners) who all work together to provide you with the care you need, when you need it.  We recommend signing up for the patient portal called "MyChart".  Sign up information is provided on this After Visit Summary.  MyChart is used to connect with patients for Virtual Visits (Telemedicine).  Patients are able to view lab/test results, encounter notes, upcoming appointments, etc.  Non-urgent messages can be sent to your provider as well.   To learn more about what you can do with MyChart, go to [email or web address].    Your next appointment:   1 year(s)  The format for your next appointment:   In Person  Provider:   Sanda Klein, MD     Other Instructions:  Hold Beta Blocker Night Before Test

## 2021-08-08 DIAGNOSIS — H40013 Open angle with borderline findings, low risk, bilateral: Secondary | ICD-10-CM | POA: Diagnosis not present

## 2021-10-03 ENCOUNTER — Other Ambulatory Visit: Payer: Self-pay | Admitting: Cardiovascular Disease

## 2021-10-17 ENCOUNTER — Encounter (HOSPITAL_COMMUNITY): Payer: BC Managed Care – PPO

## 2021-10-28 ENCOUNTER — Other Ambulatory Visit: Payer: Self-pay | Admitting: Physician Assistant

## 2021-10-28 DIAGNOSIS — I480 Paroxysmal atrial fibrillation: Secondary | ICD-10-CM

## 2021-10-29 ENCOUNTER — Other Ambulatory Visit: Payer: Self-pay | Admitting: Cardiovascular Disease

## 2021-11-01 ENCOUNTER — Ambulatory Visit (INDEPENDENT_AMBULATORY_CARE_PROVIDER_SITE_OTHER): Payer: BC Managed Care – PPO

## 2021-11-01 DIAGNOSIS — Z79899 Other long term (current) drug therapy: Secondary | ICD-10-CM

## 2021-11-01 DIAGNOSIS — Z5181 Encounter for therapeutic drug level monitoring: Secondary | ICD-10-CM

## 2021-11-01 LAB — EXERCISE TOLERANCE TEST
Angina Index: 0
Duke Treadmill Score: 6
Estimated workload: 5.3
Exercise duration (min): 5 min
Exercise duration (sec): 55 s
MPHR: 152 {beats}/min
Peak HR: 115 {beats}/min
Percent HR: 75 %
RPE: 16
Rest HR: 66 {beats}/min
ST Depression (mm): 0 mm

## 2022-03-16 ENCOUNTER — Encounter (INDEPENDENT_AMBULATORY_CARE_PROVIDER_SITE_OTHER): Payer: BC Managed Care – PPO | Admitting: Ophthalmology

## 2022-03-16 DIAGNOSIS — H35371 Puckering of macula, right eye: Secondary | ICD-10-CM | POA: Diagnosis not present

## 2022-03-16 DIAGNOSIS — H353122 Nonexudative age-related macular degeneration, left eye, intermediate dry stage: Secondary | ICD-10-CM

## 2022-03-16 DIAGNOSIS — H43813 Vitreous degeneration, bilateral: Secondary | ICD-10-CM | POA: Diagnosis not present

## 2022-04-01 ENCOUNTER — Other Ambulatory Visit: Payer: Self-pay | Admitting: Cardiovascular Disease

## 2022-04-03 NOTE — Telephone Encounter (Signed)
Prescription refill request for Eliquis received. Indication: Afib  Last office visit: 08/02/21 ([facility])  Scr: 0.59 (05/02/21)  Age: 66 Weight: 102.7kg  Appropriate dose and refill sent to requested pharmacy.

## 2022-04-22 DIAGNOSIS — Z23 Encounter for immunization: Secondary | ICD-10-CM | POA: Diagnosis not present

## 2022-05-08 DIAGNOSIS — Z23 Encounter for immunization: Secondary | ICD-10-CM | POA: Diagnosis not present

## 2022-05-08 DIAGNOSIS — E7800 Pure hypercholesterolemia, unspecified: Secondary | ICD-10-CM | POA: Diagnosis not present

## 2022-05-08 DIAGNOSIS — Z0000 Encounter for general adult medical examination without abnormal findings: Secondary | ICD-10-CM | POA: Diagnosis not present

## 2022-05-08 DIAGNOSIS — R7301 Impaired fasting glucose: Secondary | ICD-10-CM | POA: Diagnosis not present

## 2022-05-31 IMAGING — RF DG C-ARM 1-60 MIN
1 series · 6 of 6 positions shown · non-contrast
Comparison: 03/31/2020

CLINICAL DATA: ORIF ankle fracture.

EXAM:
RIGHT ANKLE - COMPLETE 3+ VIEW; DG C-ARM 1-60 MIN

[Series 1: run · 6 of 6 slices shown]
[im 1/6]
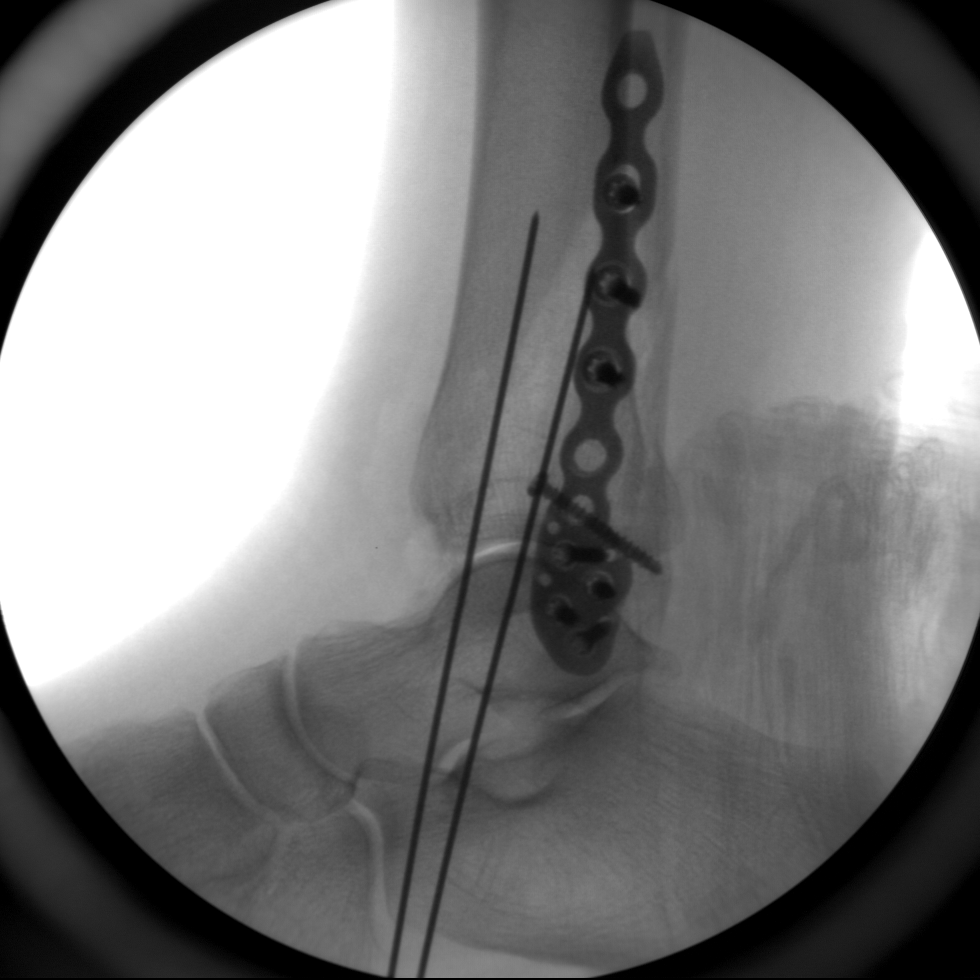
[im 2/6]
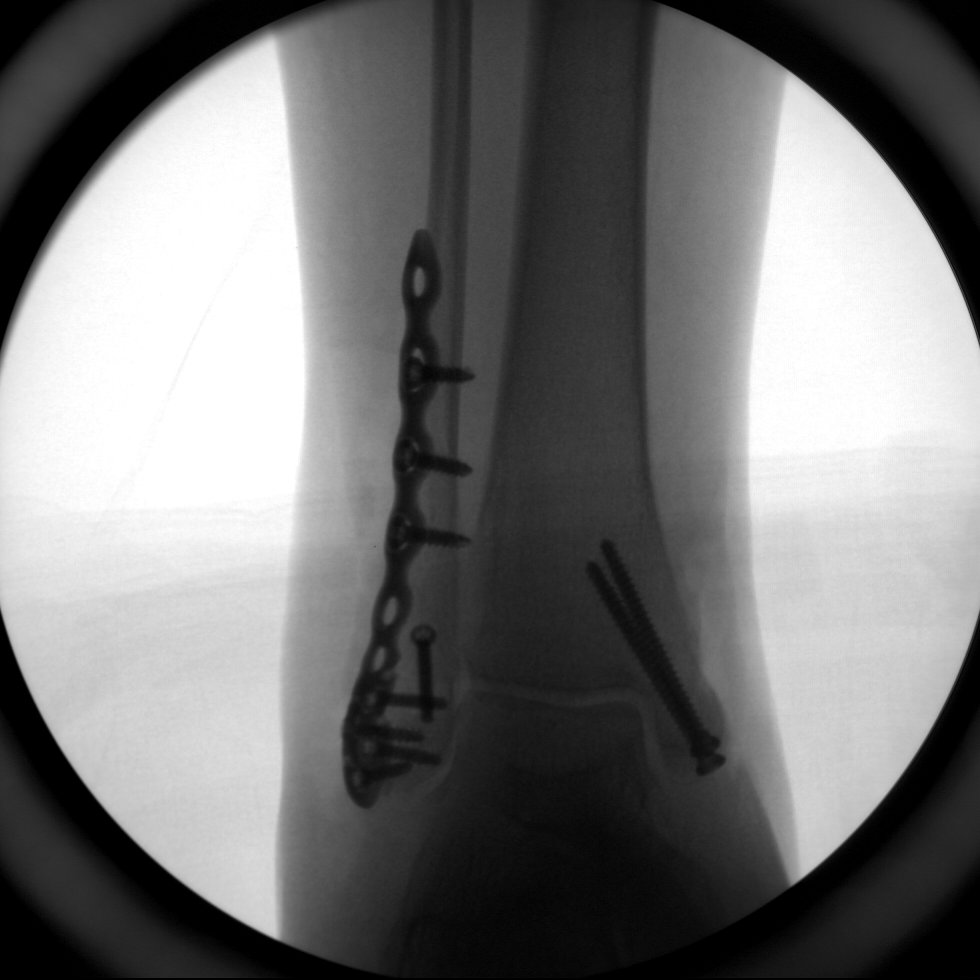
[im 3/6]
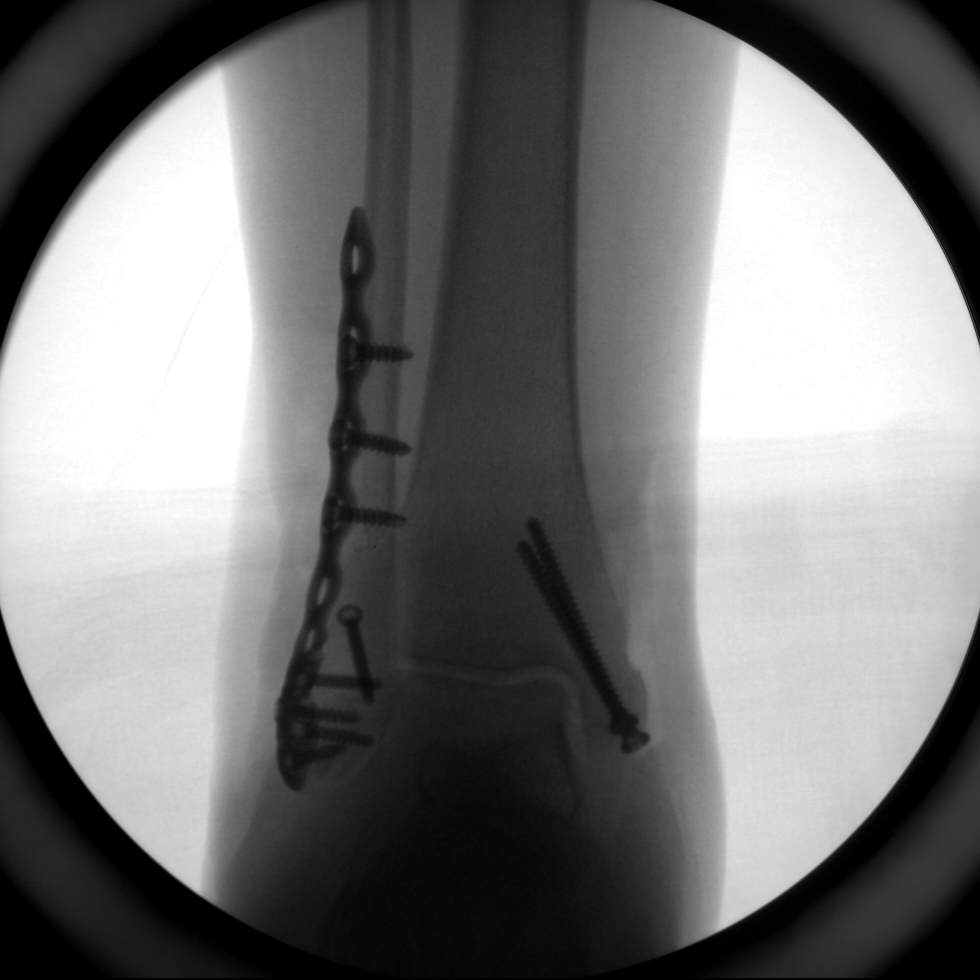
[im 4/6]
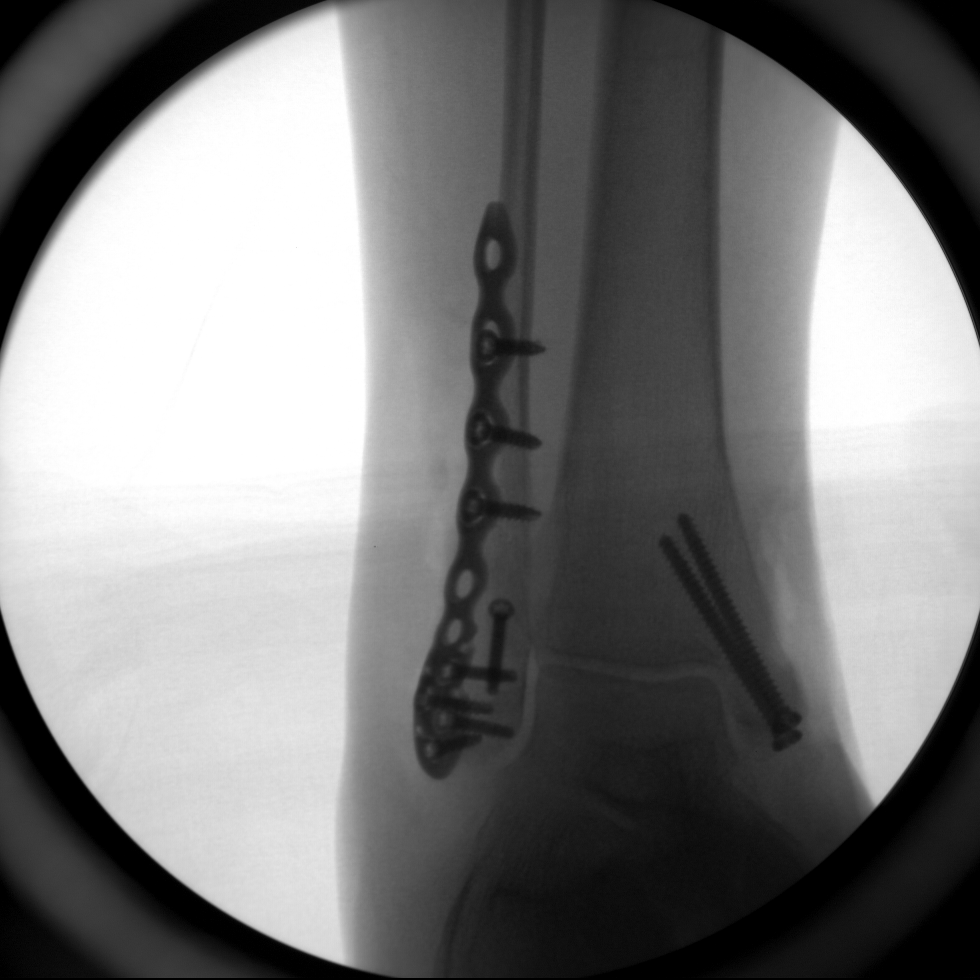
[im 5/6]
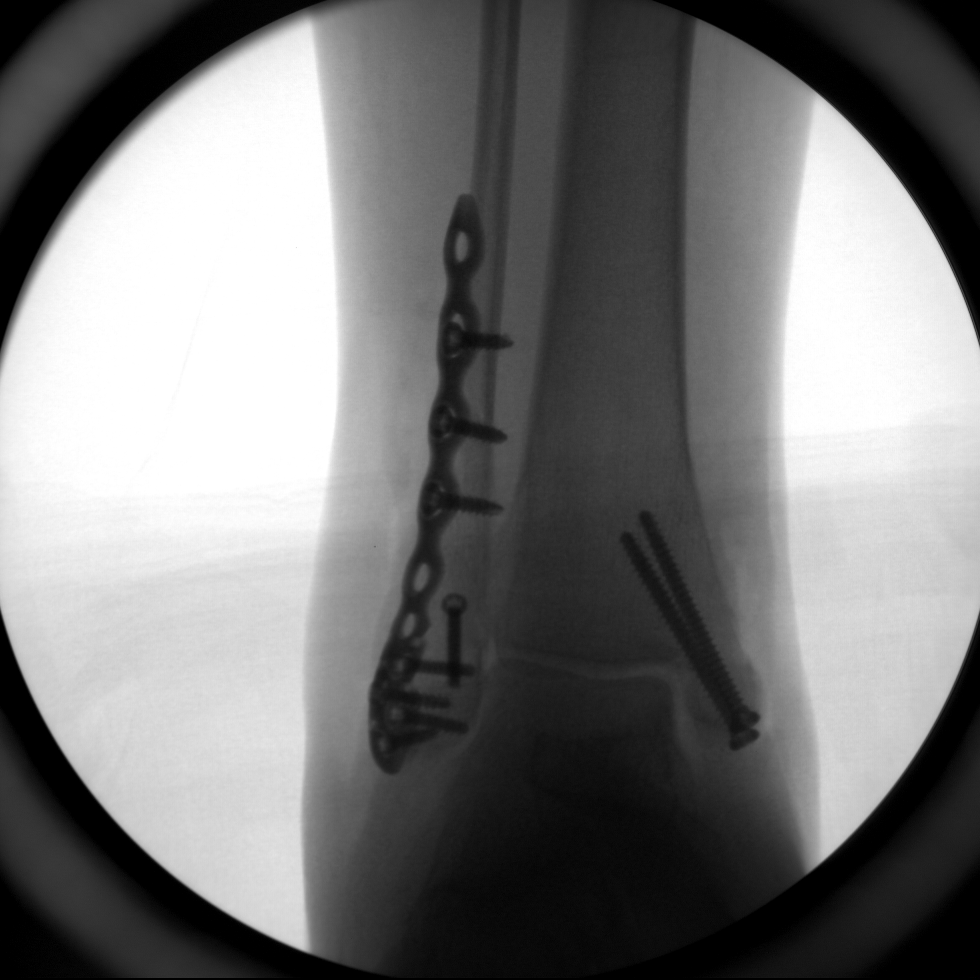
[im 6/6]
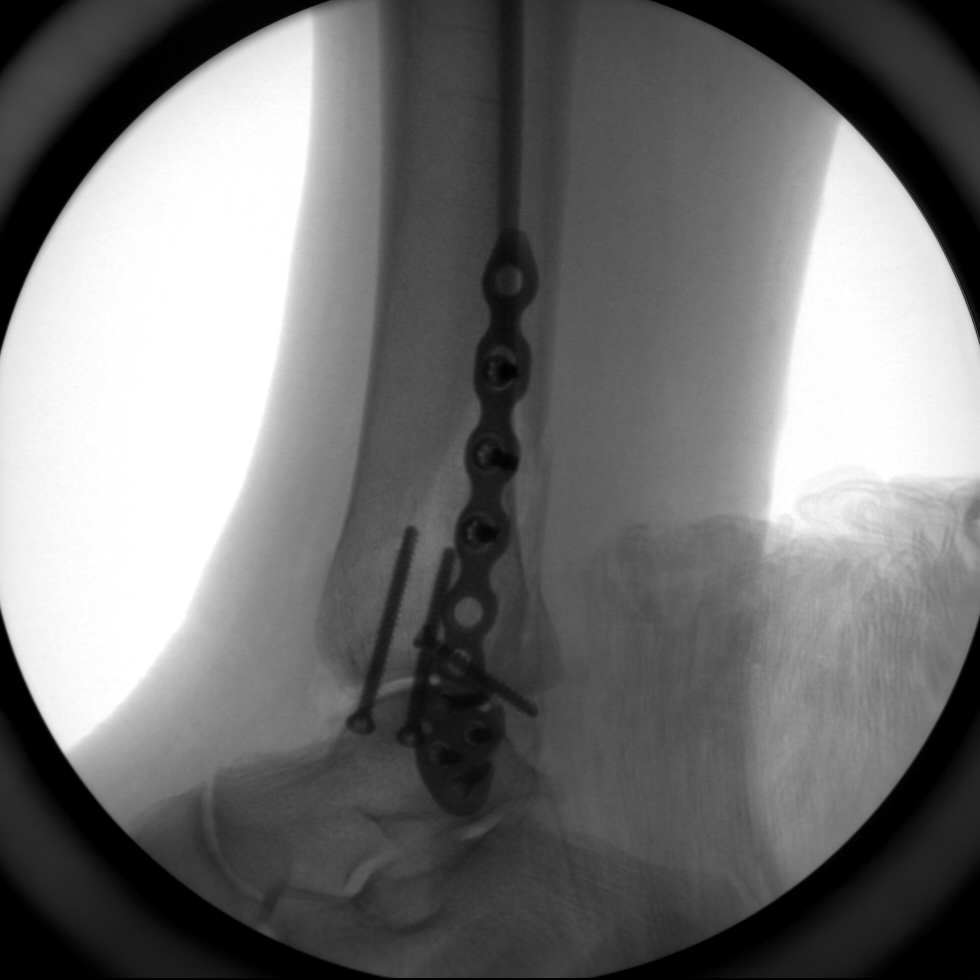

[6 of 6 positions shown; findings below may reference images not displayed]

FINDINGS: C-arm images were obtained right ankle. Normal alignment. Plate and
screw fixation of distal fibular fracture. Two screws across the
medial malleolar fracture. Satisfactory hardware placement
IMPRESSION: ORIF right ankle fracture.

## 2022-06-15 ENCOUNTER — Telehealth: Payer: Self-pay

## 2022-06-15 NOTE — Patient Outreach (Signed)
  Care Coordination   Initial Visit Note   06/15/2022 Name: Brandi Waters MRN: [id number] DOB: [birth year]  Brandi Waters is a 66 y.o. year old female who sees Blair Heys, MD for primary care. I spoke with  Valentina Lucks by phone today.  What matters to the patients health and wellness today?  No concerns today.  States she is working on losing weight and has lost about 5 lbs so far    Goals Addressed             This Visit's Progress    COMPLETED: Care Coordination Activities - no follow up required       Care Coordination Interventions: Advised patient to discuss with provider about referral to [facility] program in the future if she feels she needs more structured program Provided education to patient re: care coordination services Assessed social determinant of health barriers          SDOH assessments and interventions completed:  Yes  SDOH Interventions Today    Flowsheet Row Most Recent Value  SDOH Interventions   Food Insecurity Interventions Intervention Not Indicated  Housing Interventions Intervention Not Indicated  Transportation Interventions Intervention Not Indicated  Utilities Interventions Intervention Not Indicated        Care Coordination Interventions:  Yes, provided   Follow up plan: No further intervention required.   Encounter Outcome:  Pt. Visit Completed  Dudley Major RN, BSN,CCM, CDE Care Management Coordinator [facility] Care Management [phone]

## 2022-06-15 NOTE — Patient Instructions (Signed)
Visit Information  Thank you for taking time to visit with me today. Please don't hesitate to contact me if I can be of assistance to you.   Following are the goals we discussed today:   Goals Addressed             This Visit's Progress    COMPLETED: Care Coordination Activities - no follow up required       Care Coordination Interventions: Advised patient to discuss with provider about referral to [facility] program in the future if she feels she needs more structured program Provided education to patient re: care coordination services Assessed social determinant of health barriers           If you are experiencing a Mental Health or Behavioral Health Crisis or need someone to talk to, please call the Suicide and Crisis Lifeline: 988 call the [place] National Suicide Prevention Lifeline: 307-739-1451 or TTY: (608)755-5195 TTY 680-406-0840) to talk to a trained counselor call 1-800-273-TALK (toll free, 24 hour hotline) go to [facility] [address] [place] [phone]) call 911   Patient verbalizes understanding of instructions and care plan provided today and agrees to view in MyChart. Active MyChart status and patient understanding of how to access instructions and care plan via MyChart confirmed with patient.     No further follow up required:    Dudley Major RN, Maximiano Coss, CDE Care Management Coordinator [facility] Care Management [phone]

## 2022-06-19 ENCOUNTER — Other Ambulatory Visit: Payer: Self-pay | Admitting: Cardiovascular Disease

## 2022-09-01 DIAGNOSIS — Z23 Encounter for immunization: Secondary | ICD-10-CM | POA: Diagnosis not present

## 2022-10-02 DIAGNOSIS — H40013 Open angle with borderline findings, low risk, bilateral: Secondary | ICD-10-CM | POA: Diagnosis not present

## 2022-10-20 ENCOUNTER — Other Ambulatory Visit: Payer: Self-pay | Admitting: Cardiovascular Disease

## 2022-10-20 ENCOUNTER — Telehealth: Payer: Self-pay | Admitting: Cardiovascular Disease

## 2022-10-20 MED ORDER — METOPROLOL SUCCINATE ER 50 MG PO TB24: ORAL_TABLET | ORAL | 0 refills | Status: DC

## 2022-10-20 NOTE — Telephone Encounter (Signed)
*  STAT* If patient is at the pharmacy, call can be transferred to refill team.   1. Which medications need to be refilled? (please list name of each medication and dose if known) metoprolol succinate (TOPROL-XL) 50 MG 24 hr tablet   2. Which pharmacy/location (including street and city if local pharmacy) is medication to be sent to? [id number] - [place], [place] - [address] AT [facility] [address]   3. Do they need a 30 day or 90 day supply? 90 day  Patient only has 4 pills left.

## 2022-10-20 NOTE — Telephone Encounter (Signed)
Pt's medication was sent to pt's pharmacy as requested. Confirmation received.  °

## 2022-11-25 NOTE — Progress Notes (Unsigned)
   Cardiology Clinic Note   Date: 11/28/2022 ID: Kharter, Borries 03/14/1956, MRN [id number]  Primary Cardiologist:  Thurmon Fair, MD  Patient Profile    Brandi Waters is a 67 y.o. female who presents to the clinic today for routine follow-up.  Past medical history significant for: PAF. Echo 10/30/2011: Normal LV function.  Trace TR.  Borderline reduced RV function. Exercise stress test 11/01/2021: Moderately impaired exercise capacity.  No ST deviation noted.  No arrhythmias during stress. Hyperlipidemia. Lipid panel 05/08/2022: LDL 123, HDL 45, TG 144, total 193.  History of Present Illness    Brandi Waters is a longtime patient of cardiology.  She is followed by Dr. Royann Shivers for the above outlined history.  Patient was last seen in the office by Bettina Gavia, PA-C on 08/02/2021.  She was doing well at that time and pending wisdom tooth extraction.  She deferred ischemic evaluation (surveillance for flecainide use) until spring secondary to upcoming oral surgery.  Exercise stress test May 2023 showed no ST deviation and no arrhythmias.  Today, patient is doing well.  She reports very rare and brief palpitations typically with stress. Patient denies shortness of breath or dyspnea on exertion. No chest pain, pressure, or tightness. Denies lower extremity edema, orthopnea, or PND.  No blood in stool or urine or other bleeding concerns.  She and her husband have been working on their diet consuming a low sugar/high fiber diet.  She does not do any dedicated exercise.  She works full-time in [employer] education [employer] splitting her time between home and her office.  She has no concerns today.   ROS: All other systems reviewed and are otherwise negative except as noted in History of Present Illness.  Studies Reviewed    ECG personally reviewed by me today: NSR 61 bpm.  No significant changes from 08/02/2021.  Risk Assessment/Calculations     CHA2DS2-VASc Score = 2   This indicates a 2.2% annual risk  of stroke. The patient's score is based upon: CHF History: 0 HTN History: 0 Diabetes History: 0 Stroke History: 0 Vascular Disease History: 0 Age Score: 1 Gender Score: 1          Physical Exam    VS:  BP (!) 142/86   Pulse 61   Ht 5\' 6"  (1.676 m)   Wt 218 lb 3.2 oz (99 kg)   SpO2 97%   BMI 35.22 kg/m  , BMI Body mass index is 35.22 kg/m.  GEN: Well nourished, well developed, in no acute distress. Neck: No JVD or carotid bruits. Cardiac:  RRR. No murmurs. No rubs or gallops.   Respiratory:  Respirations regular and unlabored. Clear to auscultation without rales, wheezing or rhonchi. GI: Soft, nontender, nondistended. Extremities: Radials/DP/PT 2+ and equal bilaterally. No clubbing or cyanosis. No edema.  Skin: Warm and dry, no rash. Neuro: Strength intact.  Assessment & Plan    PAF.  Patient reports very occasional, brief palpitations typically with stress.   Denies spontaneous bleeding concerns. She is in normal sinus rhythm today.  Continue metoprolol, flecainide, Eliquis. Inactivity.  Patient is mostly sedentary and does not follow an exercise program.  She is encouraged to increase her physical activity and advance it as tolerated.  Disposition: Refill medications.  Return in 1 year or sooner as needed.         Signed, Etta Grandchild. Delorise Hunkele, DNP, NP-C

## 2022-11-28 ENCOUNTER — Ambulatory Visit: Payer: BC Managed Care – PPO | Attending: Student | Admitting: Student

## 2022-11-28 ENCOUNTER — Encounter: Payer: Self-pay | Admitting: Student

## 2022-11-28 VITALS — BP 130/70 | HR 61 | Ht 66.0 in | Wt 218.2 lb

## 2022-11-28 DIAGNOSIS — Z723 Lack of physical exercise: Secondary | ICD-10-CM

## 2022-11-28 DIAGNOSIS — I480 Paroxysmal atrial fibrillation: Secondary | ICD-10-CM | POA: Diagnosis not present

## 2022-11-28 MED ORDER — APIXABAN 5 MG PO TABS: ORAL_TABLET | ORAL | 3 refills | Status: DC

## 2022-11-28 MED ORDER — FLECAINIDE ACETATE 50 MG PO TABS: ORAL_TABLET | ORAL | 3 refills | Status: DC

## 2022-11-28 MED ORDER — METOPROLOL SUCCINATE ER 50 MG PO TB24: ORAL_TABLET | ORAL | 3 refills | Status: DC

## 2022-11-28 NOTE — Patient Instructions (Signed)
Medication Instructions:  Your physician recommends that you continue on your current medications as directed. Please refer to the Current Medication list given to you today.  *If you need a refill on your cardiac medications before your next appointment, please call your pharmacy*   Lab Work: NONE If you have labs (blood work) drawn today and your tests are completely normal, you will receive your results only by: MyChart Message (if you have MyChart) OR A paper copy in the mail If you have any lab test that is abnormal or we need to change your treatment, we will call you to review the results.   Testing/Procedures: NONE   Follow-Up: At [facility], you and your health needs are our priority.  As part of our continuing mission to provide you with exceptional heart care, we have created designated Provider Care Teams.  These Care Teams include your primary Cardiologist (physician) and Advanced Practice Providers (APPs -  Physician Assistants and Nurse Practitioners) who all work together to provide you with the care you need, when you need it.  We recommend signing up for the patient portal called "MyChart".  Sign up information is provided on this After Visit Summary.  MyChart is used to connect with patients for Virtual Visits (Telemedicine).  Patients are able to view lab/test results, encounter notes, upcoming appointments, etc.  Non-urgent messages can be sent to your provider as well.   To learn more about what you can do with MyChart, go to [email or web address].    Your next appointment:   1 year(s)  Provider:   Mihai Croitoru, MD    

## 2023-01-26 ENCOUNTER — Telehealth: Payer: Self-pay | Admitting: Cardiovascular Disease

## 2023-01-26 MED ORDER — METOPROLOL SUCCINATE ER 50 MG PO TB24: 50.0000 mg | ORAL_TABLET | Freq: Two times a day (BID) | ORAL | 3 refills | Status: DC

## 2023-01-26 NOTE — Telephone Encounter (Signed)
Taking the higher dose of metoprolol is not dangerous.  It will actually give her extra protection from rapid rates during atrial fibrillation.  However, it can make her have a slower heart rate throughout the day which can cause fatigue. If she does not complain of feeling tired or dizzy, I would simply continue what she is on now and send in a new prescription.  However if she does have fatigue or dizziness I would decrease the metoprolol to succinate to 50 mg once daily.

## 2023-01-26 NOTE — Telephone Encounter (Signed)
Gave information to patient.  She states she has been tired at end of day but assumes from just work.  If she thinks more into it and feels this is possibly from the med and not work, then she will try the metoprolol once a day and see if it improves.  However, for now she will continue at the twice daily dose .  New RX sent in

## 2023-01-26 NOTE — Telephone Encounter (Signed)
Pt c/o medication issue:  1. Name of Medication: metoprolol succinate (TOPROL-XL) 50 MG 24 hr tablet   2. How are you currently taking this medication (dosage and times per day)? Taking 2 x daily   3. Are you having a reaction (difficulty breathing--STAT)?   4. What is your medication issue? Patient is calling to get clarification on medication she has been taking 2 x daily. She noticed she ran out quicker than usual and realized it was a 24 hr tab and not what she is used to taking. Requesting call back to see what she would need to do going forward with this medication.

## 2023-01-26 NOTE — Telephone Encounter (Signed)
Patient thinks she may have been taking this medication wrong.  She states she has taken metoprolol succ 50 mg twice a day, rather than daily.  She mentions Flecainide 50 mg twice a day as well, which is correct.  Per OV notes 08/02/21 Metoprolol 50 mg  1 1/2 tablets daily 11/28/22 Metoprolol 50 mg  1 tablet Daily Patient taking Metoprolol succ 50 mg Twice a day.  Please advise if should continuee this dose or titrate down Will need new RX with changes

## 2023-03-19 ENCOUNTER — Encounter (INDEPENDENT_AMBULATORY_CARE_PROVIDER_SITE_OTHER): Payer: BC Managed Care – PPO | Admitting: Ophthalmology

## 2023-03-19 DIAGNOSIS — H43813 Vitreous degeneration, bilateral: Secondary | ICD-10-CM

## 2023-03-19 DIAGNOSIS — H2511 Age-related nuclear cataract, right eye: Secondary | ICD-10-CM | POA: Diagnosis not present

## 2023-03-19 DIAGNOSIS — H35371 Puckering of macula, right eye: Secondary | ICD-10-CM

## 2023-05-14 ENCOUNTER — Other Ambulatory Visit: Payer: Self-pay | Admitting: Family Medicine

## 2023-05-14 DIAGNOSIS — R7303 Prediabetes: Secondary | ICD-10-CM | POA: Diagnosis not present

## 2023-05-14 DIAGNOSIS — Z23 Encounter for immunization: Secondary | ICD-10-CM | POA: Diagnosis not present

## 2023-05-14 DIAGNOSIS — Z0000 Encounter for general adult medical examination without abnormal findings: Secondary | ICD-10-CM | POA: Diagnosis not present

## 2023-05-14 DIAGNOSIS — E7800 Pure hypercholesterolemia, unspecified: Secondary | ICD-10-CM | POA: Diagnosis not present

## 2023-05-14 DIAGNOSIS — Z1159 Encounter for screening for other viral diseases: Secondary | ICD-10-CM | POA: Diagnosis not present

## 2023-05-14 DIAGNOSIS — I480 Paroxysmal atrial fibrillation: Secondary | ICD-10-CM | POA: Diagnosis not present

## 2023-05-14 DIAGNOSIS — Z13820 Encounter for screening for osteoporosis: Secondary | ICD-10-CM

## 2023-05-14 DIAGNOSIS — Z1231 Encounter for screening mammogram for malignant neoplasm of breast: Secondary | ICD-10-CM

## 2023-06-01 ENCOUNTER — Other Ambulatory Visit: Payer: Self-pay | Admitting: Cardiovascular Disease

## 2023-06-19 DIAGNOSIS — E7800 Pure hypercholesterolemia, unspecified: Secondary | ICD-10-CM | POA: Diagnosis not present

## 2023-06-25 DIAGNOSIS — I4891 Unspecified atrial fibrillation: Secondary | ICD-10-CM | POA: Diagnosis not present

## 2023-06-25 DIAGNOSIS — Z7901 Long term (current) use of anticoagulants: Secondary | ICD-10-CM | POA: Diagnosis not present

## 2023-07-10 ENCOUNTER — Ambulatory Visit
Admission: RE | Admit: 2023-07-10 | Discharge: 2023-07-10 | Disposition: A | Discharge status: Home / Self Care | Payer: BC Managed Care – PPO | Source: Ambulatory Visit | Attending: Family Medicine | Admitting: Family Medicine

## 2023-07-10 DIAGNOSIS — Z1231 Encounter for screening mammogram for malignant neoplasm of breast: Secondary | ICD-10-CM | POA: Diagnosis not present

## 2023-07-20 ENCOUNTER — Telehealth: Payer: Self-pay | Admitting: Cardiovascular Disease

## 2023-07-20 NOTE — Telephone Encounter (Signed)
   Pre-operative Risk Assessment    Patient Name: Brandi Waters  DOB: 1955/08/13 MRN: [id number]   Date of last office visit: 11/28/2022 Date of next office visit: none   Request for Surgical Clearance    Procedure:   colonoscopy  Date of Surgery:  Clearance 08/09/23                                Surgeon:  Dr. Kathi Der Surgeon's Group or Practice Name:  [facility] Phone number:  [phone] Fax number:  [phone]   Type of Clearance Requested:   - Pharmacy:  Hold Apixaban (Eliquis) hold 2 days prior to procedure   Type of Anesthesia:   propofol   Additional requests/questions:    [name]   07/20/2023, 11:46 AM

## 2023-07-24 NOTE — Telephone Encounter (Signed)
Labs received from PCP:   BMET: 05/14/23 BUN 16 CREATININE 0.62 NA+ 138 K+4.4 CA+ 9.1  CBC: 05/14/23 WBC 6.6 RBC 4.46 HGB 12.8 HCT 38.7 PLT 227

## 2023-07-24 NOTE — Telephone Encounter (Signed)
Patient with diagnosis of A Fib on Eliquis for anticoagulation.    Procedure: colonoscopy Date of procedure: 08/09/23   CHA2DS2-VASc Score = 2  This indicates a 2.2% annual risk of stroke. The patient's score is based upon: CHF History: 0 HTN History: 0 Diabetes History: 0 Stroke History: 0 Vascular Disease History: 0 Age Score: 1 Gender Score: 1    CrCl 102 ml/min using adj body weight Platelet count 227K   Per office protocol, patient can hold Eliquis for 2 days prior to procedure.     **This guidance is not considered finalized until pre-operative APP has relayed final recommendations.**

## 2023-07-24 NOTE — Telephone Encounter (Signed)
   Patient Name: Brandi Waters  DOB: Jan 01, 1956 MRN: [id number]  Primary Cardiologist: Thurmon Fair, MD  Clinical pharmacists have reviewed the patient's past medical history, labs, and current medications as part of preoperative protocol coverage. The following recommendations have been made:   Patient with diagnosis of A Fib on Eliquis for anticoagulation.     Procedure: colonoscopy Date of procedure: 08/09/23     CHA2DS2-VASc Score = 2  This indicates a 2.2% annual risk of stroke. The patient's score is based upon: CHF History: 0 HTN History: 0 Diabetes History: 0 Stroke History: 0 Vascular Disease History: 0 Age Score: 1 Gender Score: 1     CrCl 102 ml/min using adj body weight Platelet count 227K     Per office protocol, patient can hold Eliquis for 2 days prior to procedure.  Please resume Eliquis as soon as possible postprocedure, at the discretion of the surgeon.    I will route this recommendation to the requesting party via Epic fax function and remove from pre-op pool.  Please call with questions.  Joylene Grapes, NP 07/24/2023, 2:42 PM

## 2023-07-24 NOTE — Telephone Encounter (Signed)
I s/w the pt about needing labs for preop clearance. Pt tells me she had labs done with Dr. Lewie Chamber, PCP [facility] 05/14/23. I will call to see if we can get these labs faxed to our office for review for preop clearance. I s/w [facility] and they will fax over labs that were done in 05/2023. Once I receive the lab reports I will be sure to forward to the preop Pharm-d for review.

## 2023-07-24 NOTE — Telephone Encounter (Signed)
Our office received labs from PCP, however in reading the notes looks like there were other labs done. I called the pt and she said yes they did a number of labs 05/14/23. I called PCP back and asked if they were faxing other labs that were done that day to our office. Angelica Chessman states yes other labs were done and they should be coming over soon to our office.   This lab that I received came over is for HCV.

## 2023-07-25 ENCOUNTER — Ambulatory Visit
Admission: RE | Admit: 2023-07-25 | Discharge: 2023-07-25 | Disposition: A | Discharge status: Home / Self Care | Payer: BC Managed Care – PPO | Source: Ambulatory Visit | Attending: Family Medicine | Admitting: Family Medicine

## 2023-07-25 ENCOUNTER — Other Ambulatory Visit: Payer: Self-pay | Admitting: Family Medicine

## 2023-07-25 DIAGNOSIS — R051 Acute cough: Secondary | ICD-10-CM | POA: Diagnosis not present

## 2023-07-25 DIAGNOSIS — Z03818 Encounter for observation for suspected exposure to other biological agents ruled out: Secondary | ICD-10-CM | POA: Diagnosis not present

## 2023-07-25 DIAGNOSIS — J069 Acute upper respiratory infection, unspecified: Secondary | ICD-10-CM | POA: Diagnosis not present

## 2023-10-10 DIAGNOSIS — H40013 Open angle with borderline findings, low risk, bilateral: Secondary | ICD-10-CM | POA: Diagnosis not present

## 2023-12-04 ENCOUNTER — Other Ambulatory Visit: Payer: Self-pay | Admitting: Cardiovascular Disease

## 2023-12-04 MED ORDER — FLECAINIDE ACETATE 50 MG PO TABS: ORAL_TABLET | ORAL | 0 refills | Status: DC

## 2023-12-04 NOTE — Addendum Note (Signed)
 Addended by: Gayleen Kawasaki D on: 12/04/2023 02:16 PM   Modules accepted: Orders

## 2023-12-28 ENCOUNTER — Other Ambulatory Visit: Payer: BC Managed Care – PPO

## 2024-01-01 ENCOUNTER — Other Ambulatory Visit: Payer: Self-pay

## 2024-01-01 MED ORDER — APIXABAN 5 MG PO TABS: ORAL_TABLET | ORAL | 3 refills | Status: AC

## 2024-01-01 NOTE — Telephone Encounter (Signed)
 Prescription refill request for Eliquis  received. Indication:afib Last office visit:upcoming Scr:0.62  11/24 Age: 68 Weight:99  kg  Prescription refilled

## 2024-01-03 ENCOUNTER — Other Ambulatory Visit: Payer: Self-pay

## 2024-01-03 MED ORDER — METOPROLOL SUCCINATE ER 50 MG PO TB24: 50.0000 mg | ORAL_TABLET | Freq: Two times a day (BID) | ORAL | 0 refills | Status: DC

## 2024-01-22 ENCOUNTER — Encounter: Payer: Self-pay | Admitting: Cardiovascular Disease

## 2024-01-22 ENCOUNTER — Ambulatory Visit: Admitting: Cardiovascular Disease

## 2024-01-22 ENCOUNTER — Ambulatory Visit
Admission: RE | Admit: 2024-01-22 | Discharge: 2024-01-22 | Disposition: A | Discharge status: Home / Self Care | Source: Ambulatory Visit | Attending: Family Medicine | Admitting: Family Medicine

## 2024-01-22 VITALS — BP 126/80 | HR 62 | Ht 66.0 in | Wt 232.8 lb

## 2024-01-22 DIAGNOSIS — Z13820 Encounter for screening for osteoporosis: Secondary | ICD-10-CM | POA: Insufficient documentation

## 2024-01-22 DIAGNOSIS — I480 Paroxysmal atrial fibrillation: Secondary | ICD-10-CM | POA: Insufficient documentation

## 2024-01-22 DIAGNOSIS — M8589 Other specified disorders of bone density and structure, multiple sites: Secondary | ICD-10-CM | POA: Diagnosis not present

## 2024-01-22 DIAGNOSIS — Z780 Asymptomatic menopausal state: Secondary | ICD-10-CM | POA: Diagnosis not present

## 2024-01-22 MED ORDER — METOPROLOL SUCCINATE ER 50 MG PO TB24: 50.0000 mg | ORAL_TABLET | Freq: Two times a day (BID) | ORAL | 3 refills | Status: AC

## 2024-01-22 MED ORDER — ROSUVASTATIN CALCIUM 10 MG PO TABS: 10.0000 mg | ORAL_TABLET | Freq: Every day | ORAL | 3 refills | Status: AC

## 2024-01-22 MED ORDER — FLECAINIDE ACETATE 50 MG PO TABS: ORAL_TABLET | ORAL | 3 refills | Status: AC

## 2024-01-22 NOTE — Patient Instructions (Signed)
 Medication Instructions:  Your physician recommends that you continue on your current medications as directed. Please refer to the Current Medication list given to you today.  *If you need a refill on your cardiac medications before your next appointment, please call your pharmacy*   Follow-Up: At [facility], you and your health needs are our priority.  As part of our continuing mission to provide you with exceptional heart care, our providers are all part of one team.  This team includes your primary Cardiologist (physician) and Advanced Practice Providers or APPs (Physician Assistants and Nurse Practitioners) who all work together to provide you with the care you need, when you need it.  Your next appointment:   12 month(s)  Provider:   Jerel Balding, MD    We recommend signing up for the patient portal called MyChart.  Sign up information is provided on this After Visit Summary.  MyChart is used to connect with patients for Virtual Visits (Telemedicine).  Patients are able to view lab/test results, encounter notes, upcoming appointments, etc.  Non-urgent messages can be sent to your provider as well.   To learn more about what you can do with MyChart, go to [email or web address].

## 2024-01-22 NOTE — Progress Notes (Signed)
 Patient ID: Brandi Waters, female   DOB: [birth year], 68 y.o.   MRN: [id number]     Cardiology Office Note    Date:  01/22/2024   ID:  Brandi Waters [birth year], MRN [id number]  PCP:  Brandi Cole, MD  Cardiologist:   Brandi Balding, MD   Chief Complaint  Patient presents with   Atrial Fibrillation     History of Present Illness:  Brandi Waters is a 68 y.o. female with a history of lone atrial fibrillation in the absence of other cardiac abnormalities. She had normal echocardiogram and stress test in the past. She has a good response to flecainide /beta blocker antiarrhythmic therapy.  She has had an uneventful year except for an episode of RSV pneumonia in January.  She did not have arrhythmia during that event.  She has not had any reduction in exercise capacity and she denies shortness of breath, chest pain either at rest or with exertion.  She has very rare isolated palpitations and has not had any sustained atrial fibrillation.  Previously on her home ECG cardia monitor the palpitations described were due to PVCs rather than atrial fibrillation.  Still has some asymmetrical swelling after the complicated right ankle fracture a couple of years ago, but does not have any edema on the other side.  Denies orthopnea, PND, focal neurological complaints.  She has not had any new falls and denies any bleeding problems on Eliquis .  She is considering seeing a nutritionist to help with weight loss.  She has prediabetes with a hemoglobin A1c of 6.1%.  Her lipid profile last December showed all parameters in target range.  As before her ECG shows sinus rhythm with a very slight prolongation of the QRS duration due to incomplete right bundle branch block.  QTc is normal.  She had a normal treadmill ECG test 2 years ago in 2023.   Past Medical History:  Diagnosis Date   Anxiety    Closed right trimalleolar fracture    Depression    GERD (gastroesophageal reflux disease)     Hyperlipidemia    Obesity    PAF (paroxysmal atrial fibrillation) (HCC)    Systemic hypertension     Past Surgical History:  Procedure Laterality Date   ABDOMINAL HYSTERECTOMY     CARDIOVASCULAR STRESS TEST  10/27/2011   Negative Bruce Protocol   EYE SURGERY Left    cataract   KIDNEY STONE SURGERY  1997   ORIF ANKLE FRACTURE Right 04/13/2020   Procedure: OPEN REDUCTION INTERNAL FIXATION (ORIF) ANKLE FRACTURE;  Surgeon: Elsa Lonni SAUNDERS, MD;  Location: [facility];  Service: Orthopedics;  Laterality: Right;   SYNDESMOSIS REPAIR Right 04/13/2020   Procedure: SYNDESMOSIS REPAIR ANKLE;  Surgeon: Elsa Lonni SAUNDERS, MD;  Location: [facility];  Service: Orthopedics;  Laterality: Right;   US  ECHOCARDIOGRAPHY  10/27/2011   trace TR, RV systolic function borderline reduced.    Current Medications: Outpatient Medications Prior to Visit  Medication Sig Dispense Refill   acetaminophen  (TYLENOL ) 500 MG tablet Take 1,000 mg by mouth every 6 (six) hours as needed for moderate pain.     ALPRAZolam (XANAX) 0.5 MG tablet Take 0.5-1 mg by mouth 2 (two) times daily as needed for anxiety.     apixaban  (ELIQUIS ) 5 MG TABS tablet TAKE 1 TABLET(5 MG) BY MOUTH TWICE DAILY 180 tablet 3   sertraline (ZOLOFT) 100 MG tablet Take 100 mg by mouth daily.     flecainide  (TAMBOCOR ) 50 MG tablet  TAKE 1 TABLET(50 MG) BY MOUTH TWICE DAILY 180 tablet 0   metoprolol  succinate (TOPROL -XL) 50 MG 24 hr tablet Take 1 tablet (50 mg total) by mouth 2 (two) times daily. Take with or immediately following a meal. 180 tablet 0   rosuvastatin  (CRESTOR ) 10 MG tablet Take 10 mg by mouth daily.     No facility-administered medications prior to visit.     Allergies:   Norepinephrine   Family History:  The patient's family history includes Cancer in her father; Cancer (age of onset: 35) in her mother; Hypertension in her father.   ROS:   Please see the history of present illness.    ROS all  other systems are reviewed and are negative  PHYSICAL EXAM:   VS:  BP 126/80 (BP Location: Left Arm, Patient Position: Sitting, Cuff Size: Large)   Pulse 62   Ht 5' 6 (1.676 m)   Wt 232 lb 12.8 oz (105.6 kg)   SpO2 98%   BMI 37.57 kg/m      General: Alert, oriented x3, no distress, moderately obese Head: no evidence of trauma, PERRL, EOMI, no exophtalmos or lid lag, no myxedema, no xanthelasma; normal ears, nose and oropharynx Neck: normal jugular venous pulsations and no hepatojugular reflux; brisk carotid pulses without delay and no carotid bruits Chest: clear to auscultation, no signs of consolidation by percussion or palpation, normal fremitus, symmetrical and full respiratory excursions Cardiovascular: normal position and quality of the apical impulse, regular rhythm, normal first and second heart sounds, no murmurs, rubs or gallops Abdomen: no tenderness or distention, no masses by palpation, no abnormal pulsatility or arterial bruits, normal bowel sounds, no hepatosplenomegaly Extremities: 1+ edema surrounding the right ankle, no edema on the left; 2+ radial, ulnar and brachial pulses bilaterally; 2+ right femoral, posterior tibial and dorsalis pedis pulses; 2+ left femoral, posterior tibial and dorsalis pedis pulses; no subclavian or femoral bruits Neurological: grossly nonfocal Psych: Normal mood and affect   Wt Readings from Last 3 Encounters:  01/22/24 232 lb 12.8 oz (105.6 kg)  11/28/22 218 lb 3.2 oz (99 kg)  08/02/21 226 lb 6.4 oz (102.7 kg)      Studies/Labs Reviewed:   EKG:    EKG Interpretation Date/Time:  Tuesday January 22 2024 08:39:42 EDT Ventricular Rate:  62 PR Interval:  192 QRS Duration:  102 QT Interval:  450 QTC Calculation: 456 R Axis:   69  Text Interpretation: Normal sinus rhythm Low voltage QRS Incomplete right bundle branch block When compared with ECG of 31-Mar-2020 12:13, No significant change since last tracing Confirmed by Ginia Rudell  ([id number]) on 01/22/2024 8:47:47 AM         ASSESSMENT:    1. Paroxysmal atrial fibrillation (HCC)      PLAN:  In order of problems listed above:  AFib: Essentially asymptomatic on flecainide  and metoprolol .  (CHADSvasc 2: age , gender).  Anticoagulation: Denies falls and bleeding complications. Moderate obesity/prediabetes: Encouraged her plan to see the nutritionist. Flecainide : No evidence of toxicity. Will plan a repeat treadmill stress test next year.  Elevated BP: In normal range today.    Medication Adjustments/Labs and Tests Ordered: Current medicines are reviewed at length with the patient today.  Concerns regarding medicines are outlined above.  Medication changes, Labs and Tests ordered today are listed in the Patient Instructions below. Patient Instructions  Medication Instructions:  Your physician recommends that you continue on your current medications as directed. Please refer to the Current Medication list given to  you today.  *If you need a refill on your cardiac medications before your next appointment, please call your pharmacy*   Follow-Up: At [facility], you and your health needs are our priority.  As part of our continuing mission to provide you with exceptional heart care, our providers are all part of one team.  This team includes your primary Cardiologist (physician) and Advanced Practice Providers or APPs (Physician Assistants and Nurse Practitioners) who all work together to provide you with the care you need, when you need it.  Your next appointment:   12 month(s)  Provider:   Jerel Balding, MD    We recommend signing up for the patient portal called MyChart.  Sign up information is provided on this After Visit Summary.  MyChart is used to connect with patients for Virtual Visits (Telemedicine).  Patients are able to view lab/test results, encounter notes, upcoming appointments, etc.  Non-urgent messages can be sent to your provider as  well.   To learn more about what you can do with MyChart, go to [email or web address].    Signed, Brandi Balding, MD  01/22/2024 9:03 AM    [facility] [address] [place], [place], [place]  [address] Phone: [phone]; Fax: [phone]

## 2024-03-17 ENCOUNTER — Encounter (INDEPENDENT_AMBULATORY_CARE_PROVIDER_SITE_OTHER): Payer: BC Managed Care – PPO | Admitting: Ophthalmology

## 2024-03-17 DIAGNOSIS — H35371 Puckering of macula, right eye: Secondary | ICD-10-CM

## 2024-03-17 DIAGNOSIS — H43813 Vitreous degeneration, bilateral: Secondary | ICD-10-CM | POA: Diagnosis not present

## 2024-03-17 DIAGNOSIS — H2511 Age-related nuclear cataract, right eye: Secondary | ICD-10-CM | POA: Diagnosis not present

## 2024-03-17 DIAGNOSIS — H353121 Nonexudative age-related macular degeneration, left eye, early dry stage: Secondary | ICD-10-CM

## 2024-04-08 ENCOUNTER — Telehealth: Payer: Self-pay | Admitting: Cardiovascular Disease

## 2024-04-08 NOTE — Telephone Encounter (Signed)
 RX sent in to Pharmacy on 01/22/24. Called Pharmacy to confirm. Called Pt to inform and Pt verbalized understanding.

## 2024-04-08 NOTE — Telephone Encounter (Signed)
*  STAT* If patient is at the pharmacy, call can be transferred to refill team.   1. Which medications need to be refilled? (please list name of each medication and dose if known) metoprolol succinate (TOPROL-XL) 50 MG 24 hr tablet  2. Which pharmacy/location (including street and city if local pharmacy) is medication to be sent to? [id number] - [place], [place] - [address]  3. Do they need a 30 day or 90 day supply? 90 day  

## 2024-05-19 DIAGNOSIS — R7303 Prediabetes: Secondary | ICD-10-CM | POA: Diagnosis not present

## 2024-05-19 DIAGNOSIS — Z0189 Encounter for other specified special examinations: Secondary | ICD-10-CM | POA: Diagnosis not present

## 2024-05-19 DIAGNOSIS — I480 Paroxysmal atrial fibrillation: Secondary | ICD-10-CM | POA: Diagnosis not present

## 2024-05-19 DIAGNOSIS — E7800 Pure hypercholesterolemia, unspecified: Secondary | ICD-10-CM | POA: Diagnosis not present

## 2025-03-18 ENCOUNTER — Encounter (INDEPENDENT_AMBULATORY_CARE_PROVIDER_SITE_OTHER): Admitting: Ophthalmology
